# Patient Record
Sex: Female | Born: 1980 | Race: Black or African American | Hispanic: No | Marital: Married | Smoking: Former smoker
Health system: Southern US, Community
[De-identification: ages and names within clinical notes are randomized; demographics above are authoritative.]

## PROBLEM LIST (undated history)

## (undated) DIAGNOSIS — J40 Bronchitis, not specified as acute or chronic: Secondary | ICD-10-CM

## (undated) DIAGNOSIS — Z87891 Personal history of nicotine dependence: Secondary | ICD-10-CM

## (undated) DIAGNOSIS — J302 Other seasonal allergic rhinitis: Secondary | ICD-10-CM

## (undated) DIAGNOSIS — O039 Complete or unspecified spontaneous abortion without complication: Secondary | ICD-10-CM

## (undated) DIAGNOSIS — F419 Anxiety disorder, unspecified: Secondary | ICD-10-CM

## (undated) DIAGNOSIS — A64 Unspecified sexually transmitted disease: Secondary | ICD-10-CM

## (undated) DIAGNOSIS — N809 Endometriosis, unspecified: Secondary | ICD-10-CM

## (undated) DIAGNOSIS — R51 Headache: Secondary | ICD-10-CM

## (undated) DIAGNOSIS — I10 Essential (primary) hypertension: Secondary | ICD-10-CM

## (undated) DIAGNOSIS — O009 Unspecified ectopic pregnancy without intrauterine pregnancy: Secondary | ICD-10-CM

## (undated) DIAGNOSIS — E785 Hyperlipidemia, unspecified: Secondary | ICD-10-CM

## (undated) DIAGNOSIS — R519 Headache, unspecified: Secondary | ICD-10-CM

## (undated) DIAGNOSIS — K219 Gastro-esophageal reflux disease without esophagitis: Secondary | ICD-10-CM

## (undated) HISTORY — DX: Anxiety disorder, unspecified: F41.9

## (undated) HISTORY — DX: Unspecified sexually transmitted disease: A64

## (undated) HISTORY — PX: ABDOMINAL HYSTERECTOMY: SHX81

## (undated) HISTORY — PX: GYNECOLOGIC CRYOSURGERY: SHX857

## (undated) HISTORY — DX: Endometriosis, unspecified: N80.9

---

## 2013-03-04 ENCOUNTER — Encounter (HOSPITAL_COMMUNITY): Payer: Self-pay | Admitting: *Deleted

## 2013-03-04 ENCOUNTER — Emergency Department (HOSPITAL_COMMUNITY)
Admission: EM | Admit: 2013-03-04 | Discharge: 2013-03-04 | Disposition: A | Discharge status: Home / Self Care | Payer: BC Managed Care – PPO | Attending: Emergency Medicine | Admitting: Emergency Medicine

## 2013-03-04 DIAGNOSIS — Z87891 Personal history of nicotine dependence: Secondary | ICD-10-CM | POA: Insufficient documentation

## 2013-03-04 DIAGNOSIS — K089 Disorder of teeth and supporting structures, unspecified: Secondary | ICD-10-CM | POA: Insufficient documentation

## 2013-03-04 DIAGNOSIS — Z79899 Other long term (current) drug therapy: Secondary | ICD-10-CM | POA: Insufficient documentation

## 2013-03-04 DIAGNOSIS — K0889 Other specified disorders of teeth and supporting structures: Secondary | ICD-10-CM

## 2013-03-04 MED ORDER — PENICILLIN V POTASSIUM 500 MG PO TABS: 500.0000 mg | ORAL_TABLET | Freq: Four times a day (QID) | ORAL | Status: AC

## 2013-03-04 MED ORDER — HYDROCODONE-ACETAMINOPHEN 5-325 MG PO TABS: 2.0000 | ORAL_TABLET | Freq: Four times a day (QID) | ORAL | Status: DC | PRN

## 2013-03-04 NOTE — ED Provider Notes (Signed)
Medical screening examination/treatment/procedure(s) were performed by non-physician practitioner and as supervising physician I was immediately available for consultation/collaboration.   Pearson Reasons Y. Reice Bienvenue, MD 03/04/13 2200 

## 2013-03-04 NOTE — ED Notes (Signed)
Pt is here with right upper tooth pain that has chipped off

## 2013-03-04 NOTE — ED Notes (Signed)
Pt reports upper right tooth chipped on Thursday and pain has been increasing since.  Pt called dentist and cannot be seen for 2 weeks.  Pt alert oriented X 4

## 2013-03-04 NOTE — ED Provider Notes (Signed)
History     CSN: [id number]  Arrival date & time 03/04/13  9147   First MD Initiated Contact with Patient 03/04/13 [phone]      Chief Complaint  Patient presents with  . Dental Pain    (Consider location/radiation/quality/duration/timing/severity/associated sxs/prior treatment) HPI Comments: Patient reports that she began having pain in her right upper tooth five days ago.  A piece of her tooth chipped off while she was flossing.  No dental injury.  She has attempted to call her dentist, but was told that she could not get in for an appointment until 03/19/13.    Patient is a 32 y.o. female presenting with tooth pain. The history is provided by the patient.  Dental PainThe primary symptoms include mouth pain. Primary symptoms do not include fever. Episode onset: 5 days ago. The symptoms are worsening. The symptoms occur constantly.  Additional symptoms include: dental sensitivity to temperature and gum tenderness. Additional symptoms do not include: gum swelling, purulent gums, trismus, facial swelling and trouble swallowing.    History reviewed. No pertinent past medical history.  History reviewed. No pertinent past surgical history.  No family history on file.  History  Substance Use Topics  . Smoking status: Former Games developer  . Smokeless tobacco: Not on file  . Alcohol Use: Yes     Comment: occ    OB History   Grav Para Term Preterm Abortions TAB SAB Ect Mult Living                  Review of Systems  Constitutional: Negative for fever and chills.  HENT: Positive for dental problem. Negative for facial swelling, trouble swallowing, neck pain and neck stiffness.   All other systems reviewed and are negative.    Allergies  Review of patient's allergies indicates no known allergies.  Home Medications   Current Outpatient Rx  Name  Route  Sig  Dispense  Refill  . acetaminophen (TYLENOL) 325 MG tablet   Oral   Take 650 mg by mouth every 6 (six) hours as needed for  pain.         . Aspirin-Acetaminophen-Caffeine (GOODY HEADACHE PO)   Oral   Take 1 packet by mouth 2 (two) times daily as needed.         . Etonogestrel (IMPLANON [place])   Subcutaneous   Inject 1 Device into the skin once.           BP 130/67  Pulse 87  Temp(Src) 98.5 F (36.9 C) (Oral)  Resp 18  SpO2 100%  Physical Exam  Nursing note and vitals reviewed. Constitutional: She is oriented to person, place, and time. She appears well-developed and well-nourished. No distress.  HENT:  Head: Normocephalic and atraumatic. No trismus in the jaw.  Mouth/Throat: Uvula is midline, oropharynx is clear and moist and mucous membranes are normal. Abnormal dentition. No dental abscesses or edematous. No oropharyngeal exudate, posterior oropharyngeal edema, posterior oropharyngeal erythema or tonsillar abscesses.  Poor dental hygiene. Pt able to open and close mouth with out difficulty. Airway intact. Uvula midline. Mild gingival swelling with tenderness over affected area, but no fluctuance. No swelling or tenderness of submental and submandibular regions.  Eyes: Conjunctivae and EOM are normal.  Neck: Normal range of motion and full passive range of motion without pain. Neck supple.  Cardiovascular: Normal rate, regular rhythm and normal heart sounds.   Pulmonary/Chest: Effort normal and breath sounds normal. No stridor. No respiratory distress. She has no wheezes.  Musculoskeletal: Normal  range of motion.  Lymphadenopathy:       Head (right side): No submental, no submandibular, no tonsillar, no preauricular and no posterior auricular adenopathy present.       Head (left side): No submental, no submandibular, no tonsillar, no preauricular and no posterior auricular adenopathy present.    She has no cervical adenopathy.  Neurological: She is alert and oriented to person, place, and time.  Skin: Skin is warm and dry. No rash noted. She is not diaphoretic.    ED Course  Procedures  (including critical care time)  Labs Reviewed - No data to display No results found.   No diagnosis found.    MDM  Patient with toothache.  No gross abscess.  Exam unconcerning for Ludwig's angina or spread of infection.  Will treat with penicillin and pain medicine.  Urged patient to follow-up with dentist.          Pascal Lux Gerlach, PA-C 03/04/13 1719

## 2013-08-21 ENCOUNTER — Emergency Department (HOSPITAL_COMMUNITY): Payer: BC Managed Care – PPO

## 2013-08-21 ENCOUNTER — Emergency Department (HOSPITAL_COMMUNITY)
Admission: EM | Admit: 2013-08-21 | Discharge: 2013-08-21 | Disposition: A | Discharge status: Home / Self Care | Payer: BC Managed Care – PPO | Attending: Emergency Medicine | Admitting: Emergency Medicine

## 2013-08-21 ENCOUNTER — Encounter (HOSPITAL_COMMUNITY): Payer: Self-pay | Admitting: *Deleted

## 2013-08-21 DIAGNOSIS — B379 Candidiasis, unspecified: Secondary | ICD-10-CM | POA: Insufficient documentation

## 2013-08-21 DIAGNOSIS — O200 Threatened abortion: Secondary | ICD-10-CM | POA: Insufficient documentation

## 2013-08-21 DIAGNOSIS — Z87891 Personal history of nicotine dependence: Secondary | ICD-10-CM | POA: Insufficient documentation

## 2013-08-21 LAB — URINALYSIS, ROUTINE W REFLEX MICROSCOPIC
Glucose, UA: NEGATIVE mg/dL
Leukocytes, UA: NEGATIVE
Specific Gravity, Urine: 1.03 (ref 1.005–1.030)
pH: 8 (ref 5.0–8.0)

## 2013-08-21 LAB — POCT PREGNANCY, URINE: Preg Test, Ur: POSITIVE — AB

## 2013-08-21 LAB — BASIC METABOLIC PANEL
BUN: 11 mg/dL (ref 6–23)
Chloride: 102 mEq/L (ref 96–112)
GFR calc Af Amer: >90 mL/min (ref >90)
Glucose, Bld: 89 mg/dL (ref 70–99)
Potassium: 4.2 mEq/L (ref 3.5–5.1)
Sodium: 136 mEq/L (ref 135–145)

## 2013-08-21 LAB — CBC WITH DIFFERENTIAL/PLATELET
Hemoglobin: 13 g/dL (ref 12.0–15.0)
Lymphs Abs: 2.7 10*3/uL (ref 0.7–4.0)
Monocytes Relative: 5 % (ref 3–12)
Neutro Abs: 4.5 10*3/uL (ref 1.7–7.7)
Neutrophils Relative %: 58 % (ref 43–77)
Platelets: 246 10*3/uL (ref 150–400)
RBC: 4.44 MIL/uL (ref 3.87–5.11)
WBC: 7.7 10*3/uL (ref 4.0–10.5)

## 2013-08-21 LAB — WET PREP, GENITAL: Trich, Wet Prep: NONE SEEN

## 2013-08-21 LAB — ABO/RH

## 2013-08-21 LAB — URINE MICROSCOPIC-ADD ON

## 2013-08-21 LAB — HCG, QUANTITATIVE, PREGNANCY: hCG, Beta Chain, Quant, S: 2936 m[IU]/mL — ABNORMAL HIGH (ref <5)

## 2013-08-21 MED ORDER — PRENATAL COMPLETE 14-0.4 MG PO TABS: 1.0000 | ORAL_TABLET | Freq: Every day | ORAL | Status: DC

## 2013-08-21 NOTE — ED Notes (Signed)
Pt reports having irregular vaginal bleeding this month, took preg test this am and it was +, having light bleeding today. No acute distress noted at triage.

## 2013-08-21 NOTE — ED Provider Notes (Signed)
Medical screening examination/treatment/procedure(s) were performed by non-physician practitioner and as supervising physician I was immediately available for consultation/collaboration.  Shon Baton, MD 08/21/13 [phone]

## 2013-08-21 NOTE — ED Provider Notes (Signed)
CSN: [id number]     Arrival date & time 08/21/13  1140 History   First MD Initiated Contact with Patient 08/21/13 1301     Chief Complaint  Patient presents with  . Vaginal Bleeding   (Consider location/radiation/quality/duration/timing/severity/associated sxs/prior Treatment) The history is provided by the patient and medical records.   Patient presents to the ED for irregular menstrual cycle. Patient states she thought her last period was 08/04/2013, but only lasted 2 days which is abnormal for her. Did home pregnancy test this morning which was positive. Patient is now G3P1.  Pt states bleeding has subsided and she only has mild, intermittent lower abdominal cramping.  Denies any nausea, vomiting, or diarrhea.  Patient is not currently on birth control.  Patient does have a history of prior ectopic pregnancy.  Pt has OB-GYN in [place].  History reviewed. No pertinent past medical history. History reviewed. No pertinent past surgical history. History reviewed. No pertinent family history. History  Substance Use Topics  . Smoking status: Former Games developer  . Smokeless tobacco: Not on file  . Alcohol Use: Yes     Comment: occ   OB History   Grav Para Term Preterm Abortions TAB SAB Ect Mult Living                 Review of Systems  Genitourinary: Positive for vaginal bleeding.  All other systems reviewed and are negative.    Allergies  Review of patient's allergies indicates no known allergies.  Home Medications   Current Outpatient Rx  Name  Route  Sig  Dispense  Refill  . Diphenhydramine-APAP, sleep, (GOODY PM PO)   Oral   Take 1 packet by mouth as needed (for pain/sleep).          BP 142/79  Pulse 109  Temp(Src) 98.5 F (36.9 C) (Oral)  Resp 18  SpO2 97%  LMP 08/04/2013  Physical Exam  Nursing note and vitals reviewed. Constitutional: She is oriented to person, place, and time. She appears well-developed and well-nourished. No distress.  HENT:  Head:  Normocephalic and atraumatic.  Eyes: Conjunctivae and EOM are normal. Pupils are equal, round, and reactive to light.  Neck: Normal range of motion. Neck supple.  Cardiovascular: Normal rate, regular rhythm and normal heart sounds.   Pulmonary/Chest: Effort normal and breath sounds normal. No respiratory distress. She has no wheezes.  Abdominal: Soft. Bowel sounds are normal. There is no tenderness. There is no guarding.  Lower abdominal "cramping" without focal TTP  Genitourinary: There is no lesion on the right labia. There is no lesion on the left labia. Cervix exhibits no motion tenderness. Right adnexum displays no tenderness. Left adnexum displays no tenderness. There is bleeding around the vagina. Vaginal discharge found.  Cervical os closed; scant, non-odorous, purulent vaginal discharge with brown streaking; no adnexal or CMT  Musculoskeletal: Normal range of motion. She exhibits no edema.  Neurological: She is alert and oriented to person, place, and time.  Skin: Skin is warm and dry. She is not diaphoretic.  Psychiatric: She has a normal mood and affect.    ED Course  Procedures (including critical care time) Labs Review Labs Reviewed  WET PREP, GENITAL - Abnormal; Notable for the following:    Yeast Wet Prep HPF POC MANY (*)    WBC, Wet Prep HPF POC FEW (*)    All other components within normal limits  URINALYSIS, ROUTINE W REFLEX MICROSCOPIC - Abnormal; Notable for the following:    Color, Urine  AMBER (*)    Hgb urine dipstick TRACE (*)    Ketones, ur 15 (*)    All other components within normal limits  BASIC METABOLIC PANEL - Abnormal; Notable for the following:    GFR calc non Af Amer 85 (*)    All other components within normal limits  HCG, QUANTITATIVE, PREGNANCY - Abnormal; Notable for the following:    hCG, Beta Chain, Quant, S 2936 (*)    All other components within normal limits  POCT PREGNANCY, URINE - Abnormal; Notable for the following:    Preg Test, Ur  POSITIVE (*)    All other components within normal limits  GC/CHLAMYDIA PROBE AMP  CBC WITH DIFFERENTIAL  URINE MICROSCOPIC-ADD ON  ABO/RH   Imaging Review US Ob Comp Less 14 Wks  08/21/2013   *RADIOLOGY REPORT*  Clinical Data: Early pregnancy.  Vaginal bleeding and pelvic cramping.  OBSTETRIC <14 WK [place] AND TRANSVAGINAL OB US  Technique:  Both transabdominal and transvaginal ultrasound examinations were performed for complete evaluation of the gestation as well as the maternal uterus, adnexal regions, and pelvic cul-de-sac.  Transvaginal technique was performed to assess early pregnancy.  Comparison:  None.  Intrauterine gestational sac:  None Yolk sac: None Embryo: None Cardiac Activity: None  Maternal uterus/adnexae: There is a 5.0 x 4.1 x 4.4 cm fibroid in the body of the uterus which obscures the endometrium in the fundus.  There is a 1.5 x 1.0 x 1.3 cm simple appearing cyst on the left ovary.  There is a slightly complex 1.6 x 1.4 x 1.5 cm cyst in the right ovary with some debris in the dependent portion of the cyst. This probably represents a hemorrhagic cyst.  There is a small amount of free fluid in the pelvic cul-de-sac.  IMPRESSION:  1. No visible intrauterine gestational sac. 2.  Small cysts in both ovaries. 3.  No visible adnexal masses. 4.  5 cm fibroid in the uterus obscures the endometrium in the fundus.   Original Report Authenticated By: Francene Boyers, M.D.   US Ob Transvaginal  08/21/2013   *RADIOLOGY REPORT*  Clinical Data: Early pregnancy.  Vaginal bleeding and pelvic cramping.  OBSTETRIC <14 WK [place] AND TRANSVAGINAL OB US  Technique:  Both transabdominal and transvaginal ultrasound examinations were performed for complete evaluation of the gestation as well as the maternal uterus, adnexal regions, and pelvic cul-de-sac.  Transvaginal technique was performed to assess early pregnancy.  Comparison:  None.  Intrauterine gestational sac:  None Yolk sac: None Embryo: None Cardiac Activity:  None  Maternal uterus/adnexae: There is a 5.0 x 4.1 x 4.4 cm fibroid in the body of the uterus which obscures the endometrium in the fundus.  There is a 1.5 x 1.0 x 1.3 cm simple appearing cyst on the left ovary.  There is a slightly complex 1.6 x 1.4 x 1.5 cm cyst in the right ovary with some debris in the dependent portion of the cyst. This probably represents a hemorrhagic cyst.  There is a small amount of free fluid in the pelvic cul-de-sac.  IMPRESSION:  1. No visible intrauterine gestational sac. 2.  Small cysts in both ovaries. 3.  No visible adnexal masses. 4.  5 cm fibroid in the uterus obscures the endometrium in the fundus.   Original Report Authenticated By: Francene Boyers, M.D.    MDM   1. Threatened abortion in first trimester   2. Yeast infection     u-preg positive.  U/a without signs of  infection, trace blood.  Wet prep with many yeast.  Gc/Chl pending.  U/s as above.  No visible uterine sac at this time, quant 2936.  ABO/Rh is A+.  I have recommended close FU with OB-GYN.  She will be started on pre-natal vitamins.  Instructed to use OTC monistat for yeast infection.  Given strict return precautions and miscarriage warning signs that would warrant ED return.  Discussed plan with pt, she agreed.  Return precautions advised.  Garlon Hatchet, PA-C 08/21/13 [phone]

## 2013-09-16 ENCOUNTER — Emergency Department (HOSPITAL_COMMUNITY)
Admission: EM | Admit: 2013-09-16 | Discharge: 2013-09-16 | Discharge status: Against Medical Advice | Payer: BC Managed Care – PPO | Attending: Emergency Medicine | Admitting: Emergency Medicine

## 2013-09-16 ENCOUNTER — Encounter (HOSPITAL_COMMUNITY): Payer: Self-pay | Admitting: Emergency Medicine

## 2013-09-16 DIAGNOSIS — Z87891 Personal history of nicotine dependence: Secondary | ICD-10-CM | POA: Insufficient documentation

## 2013-09-16 DIAGNOSIS — R109 Unspecified abdominal pain: Secondary | ICD-10-CM

## 2013-09-16 DIAGNOSIS — N898 Other specified noninflammatory disorders of vagina: Secondary | ICD-10-CM | POA: Insufficient documentation

## 2013-09-16 DIAGNOSIS — Z79899 Other long term (current) drug therapy: Secondary | ICD-10-CM | POA: Insufficient documentation

## 2013-09-16 DIAGNOSIS — Z8759 Personal history of other complications of pregnancy, childbirth and the puerperium: Secondary | ICD-10-CM

## 2013-09-16 DIAGNOSIS — Z3201 Encounter for pregnancy test, result positive: Secondary | ICD-10-CM | POA: Insufficient documentation

## 2013-09-16 DIAGNOSIS — Z8742 Personal history of other diseases of the female genital tract: Secondary | ICD-10-CM | POA: Insufficient documentation

## 2013-09-16 DIAGNOSIS — R1032 Left lower quadrant pain: Secondary | ICD-10-CM | POA: Insufficient documentation

## 2013-09-16 HISTORY — DX: Unspecified ectopic pregnancy without intrauterine pregnancy: O00.90

## 2013-09-16 LAB — URINALYSIS, ROUTINE W REFLEX MICROSCOPIC
Glucose, UA: NEGATIVE mg/dL
Leukocytes, UA: NEGATIVE
Protein, ur: NEGATIVE mg/dL
pH: 8.5 — ABNORMAL HIGH (ref 5.0–8.0)

## 2013-09-16 LAB — CBC WITH DIFFERENTIAL/PLATELET
HCT: 37.6 % (ref 36.0–46.0)
Hemoglobin: 12.9 g/dL (ref 12.0–15.0)
Lymphocytes Relative: 35 % (ref 12–46)
Lymphs Abs: 2.2 10*3/uL (ref 0.7–4.0)
MCH: 30.1 pg (ref 26.0–34.0)
MCV: 87.9 fL (ref 78.0–100.0)
Neutro Abs: 3.7 10*3/uL (ref 1.7–7.7)
Neutrophils Relative %: 58 % (ref 43–77)
Platelets: 242 10*3/uL (ref 150–400)

## 2013-09-16 LAB — COMPREHENSIVE METABOLIC PANEL
ALT: 10 U/L (ref 0–35)
AST: 13 U/L (ref 0–37)
Albumin: 3.7 g/dL (ref 3.5–5.2)
Alkaline Phosphatase: 48 U/L (ref 39–117)
Calcium: 8.8 mg/dL (ref 8.4–10.5)
GFR calc Af Amer: >90 mL/min (ref >90)
Glucose, Bld: 82 mg/dL (ref 70–99)
Potassium: 4 mEq/L (ref 3.5–5.1)
Sodium: 141 mEq/L (ref 135–145)
Total Protein: 6.7 g/dL (ref 6.0–8.3)

## 2013-09-16 LAB — PREGNANCY, URINE: Preg Test, Ur: POSITIVE — AB

## 2013-09-16 LAB — HCG, QUANTITATIVE, PREGNANCY: hCG, Beta Chain, Quant, S: 14 m[IU]/mL — ABNORMAL HIGH (ref <5)

## 2013-09-16 NOTE — ED Provider Notes (Signed)
CSN: [id number]     Arrival date & time 09/16/13  1032 History   First MD Initiated Contact with Patient 09/16/13 1142     Chief Complaint  Patient presents with  . Abdominal Pain   (Consider location/radiation/quality/duration/timing/severity/associated sxs/prior Treatment) HPI Comments: Patient is a 32 year old female who is s/p ectopic pregnancy with surgery 2 weeks ago who presents with abdominal pain that started this morning. The pain is located in her LLQ and does radiate. The pain is described as aching and severe. The pain started gradually and progressively worsened since the onset. No alleviating/aggravating factors. The patient has tried nothing for symptoms without relief. Associated symptoms include vaginal spotting. Patient denies fever, headache, NVD, chest pain, SOB, dysuria, constipation.      Past Medical History  Diagnosis Date  . Ectopic pregnancy    History reviewed. No pertinent past surgical history. History reviewed. No pertinent family history. History  Substance Use Topics  . Smoking status: Former Games developer  . Smokeless tobacco: Not on file  . Alcohol Use: Yes     Comment: occ   OB History   Grav Para Term Preterm Abortions TAB SAB Ect Mult Living                 Review of Systems  Gastrointestinal: Positive for abdominal pain.  All other systems reviewed and are negative.    Allergies  Review of patient's allergies indicates no known allergies.  Home Medications   Current Outpatient Rx  Name  Route  Sig  Dispense  Refill  . Diphenhydramine-APAP, sleep, (GOODY PM PO)   Oral   Take 1 packet by mouth as needed (for pain/sleep).         Marland Kitchen HYDROcodone-acetaminophen (NORCO) 10-325 MG per tablet   Oral   Take 1 tablet by mouth every 6 (six) hours as needed for pain.         Marland Kitchen ibuprofen (ADVIL,MOTRIN) 200 MG tablet   Oral   Take 200 mg by mouth every 6 (six) hours as needed for pain (pain).         . Prenatal Vit-Fe Fumarate-FA  (PRENATAL COMPLETE) 14-0.4 MG TABS   Oral   Take 1 tablet by mouth daily.   60 each   0    BP 148/83  Pulse 89  Temp(Src) 98.9 F (37.2 C) (Oral)  Resp 18  Ht 4\' 11"  (1.499 m)  Wt 164 lb 14.4 oz (74.798 kg)  BMI 33.29 kg/m2  SpO2 99%  LMP 08/04/2013 Physical Exam  Nursing note and vitals reviewed. Constitutional: She is oriented to person, place, and time. She appears well-developed and well-nourished. No distress.  HENT:  Head: Normocephalic and atraumatic.  Eyes: Conjunctivae and EOM are normal.  Neck: Normal range of motion.  Cardiovascular: Normal rate and regular rhythm.  Exam reveals no gallop and no friction rub.   No murmur heard. Pulmonary/Chest: Effort normal and breath sounds normal. She has no wheezes. She has no rales. She exhibits no tenderness.  Abdominal: Soft. She exhibits no distension. There is tenderness. There is no rebound and no guarding.  Left lower quadrant tenderness to palpation. No peritoneal signs or other focal tenderness to palpation.   Musculoskeletal: Normal range of motion.  Neurological: She is alert and oriented to person, place, and time. Coordination normal.  Speech is goal-oriented. Moves limbs without ataxia.   Skin: Skin is warm and dry.  Psychiatric: She has a normal mood and affect. Her behavior is normal.  ED Course  Procedures (including critical care time) Labs Review Labs Reviewed  COMPREHENSIVE METABOLIC PANEL - Abnormal; Notable for the following:    Total Bilirubin 0.2 (*)    GFR calc non Af Amer 87 (*)    All other components within normal limits  URINALYSIS, ROUTINE W REFLEX MICROSCOPIC - Abnormal; Notable for the following:    pH 8.5 (*)    All other components within normal limits  PREGNANCY, URINE - Abnormal; Notable for the following:    Preg Test, Ur POSITIVE (*)    All other components within normal limits  URINE CULTURE  CBC WITH DIFFERENTIAL  HCG, QUANTITATIVE, PREGNANCY   Imaging Review No results  found.  EKG Interpretation   None       MDM   1. Abdominal pain   2. Positive urine pregnancy test   3. History of ectopic pregnancy     11:48 AM Labs and urinalysis pending. Vitals stable and patient afebrile.   1:15 PM Patient's urinalysis likely shows false positive pregnancy test due to recent ectopic pregnancy. Patient needs to leave and take her mother to an appointment so she will leave AMA. Patient says she will return after the appointment for a pelvic US.     Emilia Beck, PA-C 09/16/13 1327

## 2013-09-16 NOTE — ED Notes (Signed)
Patient had surgery two weeks ago to remove an ectopic pregnancy.  The patient had a follow up appointment yesterday with her primary care physician and they said everything looked good.  She started to have pain about 0300 in the morning and it has gotten worse.  She decided to come in to be evaluated.  She did not call the PCP today and she is not certain if this pain is normal after surgery.

## 2013-09-16 NOTE — ED Notes (Signed)
Pt c/o lower abd pain; pt sts had sx for ectopic pregnancy 2 weeks ago; pt sts some vaginal spotting and sts pain started today

## 2013-09-17 LAB — URINE CULTURE: Colony Count: 2000

## 2013-09-19 NOTE — ED Provider Notes (Signed)
Medical screening examination/treatment/procedure(s) were performed by non-physician practitioner and as supervising physician I was immediately available for consultation/collaboration.   Laray Anger, DO 09/19/13 1321

## 2013-12-04 HISTORY — PX: ECTOPIC PREGNANCY SURGERY: SHX613

## 2015-01-19 ENCOUNTER — Emergency Department (HOSPITAL_COMMUNITY)
Admission: EM | Admit: 2015-01-19 | Discharge: 2015-01-19 | Disposition: A | Discharge status: Home / Self Care | Payer: Self-pay | Attending: Emergency Medicine | Admitting: Emergency Medicine

## 2015-01-19 ENCOUNTER — Encounter (HOSPITAL_COMMUNITY): Payer: Self-pay | Admitting: Neurology

## 2015-01-19 DIAGNOSIS — Z79899 Other long term (current) drug therapy: Secondary | ICD-10-CM | POA: Insufficient documentation

## 2015-01-19 DIAGNOSIS — Z3A01 Less than 8 weeks gestation of pregnancy: Secondary | ICD-10-CM | POA: Insufficient documentation

## 2015-01-19 DIAGNOSIS — N939 Abnormal uterine and vaginal bleeding, unspecified: Secondary | ICD-10-CM

## 2015-01-19 DIAGNOSIS — Z87891 Personal history of nicotine dependence: Secondary | ICD-10-CM | POA: Insufficient documentation

## 2015-01-19 DIAGNOSIS — O9989 Other specified diseases and conditions complicating pregnancy, childbirth and the puerperium: Secondary | ICD-10-CM | POA: Insufficient documentation

## 2015-01-19 DIAGNOSIS — R000 Tachycardia, unspecified: Secondary | ICD-10-CM | POA: Insufficient documentation

## 2015-01-19 DIAGNOSIS — O200 Threatened abortion: Secondary | ICD-10-CM | POA: Insufficient documentation

## 2015-01-19 HISTORY — DX: Complete or unspecified spontaneous abortion without complication: O03.9

## 2015-01-19 LAB — CBC WITH DIFFERENTIAL/PLATELET
BASOS ABS: 0 10*3/uL (ref 0.0–0.1)
BASOS PCT: 1 % (ref 0–1)
EOS ABS: 0.1 10*3/uL (ref 0.0–0.7)
EOS PCT: 1 % (ref 0–5)
HEMATOCRIT: 37.1 % (ref 36.0–46.0)
HEMOGLOBIN: 12.4 g/dL (ref 12.0–15.0)
Lymphocytes Relative: 38 % (ref 12–46)
Lymphs Abs: 2.2 10*3/uL (ref 0.7–4.0)
MCH: 29.2 pg (ref 26.0–34.0)
MCHC: 33.4 g/dL (ref 30.0–36.0)
MCV: 87.5 fL (ref 78.0–100.0)
MONO ABS: 0.3 10*3/uL (ref 0.1–1.0)
MONOS PCT: 6 % (ref 3–12)
Neutro Abs: 3.2 10*3/uL (ref 1.7–7.7)
Neutrophils Relative %: 54 % (ref 43–77)
Platelets: 232 10*3/uL (ref 150–400)
RBC: 4.24 MIL/uL (ref 3.87–5.11)
RDW: 12.7 % (ref 11.5–15.5)
WBC: 5.8 10*3/uL (ref 4.0–10.5)

## 2015-01-19 LAB — BASIC METABOLIC PANEL
Anion gap: 7 (ref 5–15)
BUN: 6 mg/dL (ref 6–23)
CALCIUM: 8.8 mg/dL (ref 8.4–10.5)
CO2: 23 mmol/L (ref 19–32)
CREATININE: 0.97 mg/dL (ref 0.50–1.10)
Chloride: 108 mmol/L (ref 96–112)
GFR calc Af Amer: 89 mL/min — ABNORMAL LOW (ref >90)
GFR, EST NON AFRICAN AMERICAN: 76 mL/min — AB (ref >90)
Glucose, Bld: 117 mg/dL — ABNORMAL HIGH (ref 70–99)
Potassium: 3.8 mmol/L (ref 3.5–5.1)
SODIUM: 138 mmol/L (ref 135–145)

## 2015-01-19 LAB — WET PREP, GENITAL
Trich, Wet Prep: NONE SEEN
Yeast Wet Prep HPF POC: NONE SEEN

## 2015-01-19 LAB — I-STAT BETA HCG BLOOD, ED (MC, WL, AP ONLY): I-stat hCG, quantitative: 113.1 m[IU]/mL — ABNORMAL HIGH (ref <5)

## 2015-01-19 MED ORDER — SODIUM CHLORIDE 0.9 % IV BOLUS (SEPSIS)
1000.0000 mL | Freq: Once | INTRAVENOUS | Status: AC
  Administered 2015-01-19: 1000 mL via INTRAVENOUS

## 2015-01-19 NOTE — ED Provider Notes (Signed)
CSN: [id number]     Arrival date & time 01/19/15  1009 History   First MD Initiated Contact with Patient 01/19/15 1011     Chief Complaint  Patient presents with  . Vaginal Bleeding     (Consider location/radiation/quality/duration/timing/severity/associated sxs/prior Treatment) HPI Comments: Patient with history of left-sided ectopic pregnancy treated with surgery however patient states that she still has her fallopian tube -- presents with known pregnancy and spotting. Patient initially had a small amount of vaginal bleeding 5 days ago. This stopped the next day so she took a pregnancy test which was positive. The subsequent day she was having some abdominal pain and she presented to an outside clinic where she had a quantitative approximately 100-200, and ultrasound showing a right-sided cyst but no other problems and no intrauterine pregnancy. Patient was discharged to follow-up with her OB doctor. This morning she started having a small amount of spotting again. She has not developed any pain and she has not felt lightheaded or short of breath and has not passed out. No nausea, vomiting, diarrhea or urinary symptoms. No treatments prior to arrival. Patient states that she called her after-hours OB/GYN hotline was referred to the emergency department for evaluation. The onset of this condition was acute. The course is constant. Aggravating factors: none. Alleviating factors: none.    Patient is a 34 y.o. female presenting with vaginal bleeding. The history is provided by the patient.  Vaginal Bleeding Associated symptoms: no abdominal pain, no dysuria, no fever and no nausea     Past Medical History  Diagnosis Date  . Ectopic pregnancy   . Miscarriage    History reviewed. No pertinent past surgical history. No family history on file. History  Substance Use Topics  . Smoking status: Former Research scientist (life sciences)  . Smokeless tobacco: Not on file  . Alcohol Use: Yes     Comment: occ   OB History      No data available     Review of Systems  Constitutional: Negative for fever.  HENT: Negative for rhinorrhea and sore throat.   Eyes: Negative for redness.  Respiratory: Negative for cough.   Cardiovascular: Negative for chest pain.  Gastrointestinal: Negative for nausea, vomiting, abdominal pain and diarrhea.  Genitourinary: Positive for vaginal bleeding. Negative for dysuria.  Musculoskeletal: Negative for myalgias.  Skin: Negative for rash.  Neurological: Negative for headaches.   Allergies  Review of patient's allergies indicates no known allergies.  Home Medications   Prior to Admission medications   Medication Sig Start Date End Date Taking? Authorizing Provider  Diphenhydramine-APAP, sleep, (GOODY PM PO) Take 1 packet by mouth as needed (for pain/sleep).    Historical Provider, MD  HYDROcodone-acetaminophen (NORCO) 10-325 MG per tablet Take 1 tablet by mouth every 6 (six) hours as needed for pain.    Historical Provider, MD  ibuprofen (ADVIL,MOTRIN) 200 MG tablet Take 200 mg by mouth every 6 (six) hours as needed for pain (pain).    Historical Provider, MD  Prenatal Vit-Fe Fumarate-FA (PRENATAL COMPLETE) 14-0.4 MG TABS Take 1 tablet by mouth daily. 08/21/13   Larene Pickett, PA-C   BP 138/81 mmHg  Pulse 110  Temp(Src) 98.3 F (36.8 C) (Oral)  Resp 14  SpO2 99%  LMP 12/13/2014   Physical Exam  Constitutional: She appears well-developed and well-nourished.  HENT:  Head: Normocephalic and atraumatic.  Eyes: Conjunctivae are normal. Right eye exhibits no discharge. Left eye exhibits no discharge.  Neck: Normal range of motion. Neck supple.  Cardiovascular:  Regular rhythm and normal heart sounds.  Tachycardia present.   No murmur heard. Pulmonary/Chest: Effort normal and breath sounds normal.  Abdominal: Soft. Bowel sounds are normal. She exhibits no distension. There is no tenderness. There is no rebound and no guarding.  No tenderness, guarding, or rebound on exam.    Genitourinary: There is no rash, tenderness or lesion on the right labia. There is no rash, tenderness or lesion on the left labia. Uterus is not tender. Cervix exhibits discharge (blood). Cervix exhibits no motion tenderness and no friability. There is bleeding in the vagina. No erythema or tenderness in the vagina. No foreign body around the vagina. No signs of injury around the vagina. No vaginal discharge found.    Neurological: She is alert.  Skin: Skin is warm and dry.  Psychiatric: She has a normal mood and affect.  Nursing note and vitals reviewed.   ED Course  Procedures (including critical care time) Labs Review Labs Reviewed  WET PREP, GENITAL - Abnormal; Notable for the following:    Clue Cells Wet Prep HPF POC FEW (*)    WBC, Wet Prep HPF POC FEW (*)    All other components within normal limits  BASIC METABOLIC PANEL - Abnormal; Notable for the following:    Glucose, Bld 117 (*)    GFR calc non Af Amer 76 (*)    GFR calc Af Amer 89 (*)    All other components within normal limits  I-STAT BETA HCG BLOOD, ED ([facility], [facility], [facility] ONLY) - Abnormal; Notable for the following:    I-stat hCG, quantitative 113.1 (*)    All other components within normal limits  CBC WITH DIFFERENTIAL/PLATELET  GC/CHLAMYDIA PROBE AMP ([place])    Imaging Review No results found.   EKG Interpretation None       10:48 AM Patient seen and examined. Work-up initiated. Discussed with Dr. Leonides Schanz.   Vital signs reviewed and are as follows: BP 138/81 mmHg  Pulse 110  Temp(Src) 98.3 F (36.8 C) (Oral)  Resp 14  SpO2 99%  LMP 12/13/2014  12:48 PM Findings reviewed with Dr. Leonides Schanz. Patient informed of results. Informed of need for follow-up and patient will call her gynecologist upon leaving here today for an appointment and for serial beta hCGs.  Patient states that her heart rate typically runs a little high. We will give her fluids and if still doing well will discharge to home. Patient  continues to have no abdominal pain whatsoever. Do not suspect ruptured ectopic pregnancy.    MDM   Final diagnoses:  Vaginal bleeding  Threatened abortion   Patient presents with complaint of vaginal bleeding in setting of pregnancy. Patient was seen at [facility] emergency department 3 days ago. I was able to review these records in care everywhere. She had a Quant of 175 and an ultrasound which did not demonstrate IUP, showed right ovarian cyst. Patient returns today with continued bleeding but no pain. Bleeding is mild. Electrolytes and blood counts are normal. I-STAT hCG is lower today than results from 3 days ago. This may suggest impending spontaneous abortion. Patient currently does not have any pain or peritonitis to suggest ruptured ectopic pregnancy. Her vital signs show mild tachycardia. Patient has had this in the past on other ED notes but not always. Do not suspect that this is tachycardia due to acute blood loss or infection. Patient was treated with 1 L of normal saline. She does not feel lightheaded with standing. Patient has reliable OB/GYN  follow-up and she plans to follow-up in 2 days for recheck of her Quant. Patient informed that this represents a threatened abortion and that she will need further trending of her Quant in order to make a final diagnosis.   Carlisle Cater, PA-C 01/19/15 [place], DO 01/19/15 1520

## 2015-01-19 NOTE — ED Notes (Signed)
Pt reports vaginal bleeding on Thursday that stopped, spotting starting again this morning that is small. Has hx of ectopic pregnancy 2 years ago, she found out she was pregnant on Friday via home test. Denies pain.

## 2015-01-19 NOTE — Discharge Instructions (Signed)
Please read and follow all provided instructions.  Your diagnoses today include:  1. Vaginal bleeding   2. Threatened abortion     Tests performed today include:  Blood counts and electrolytes - normal blood counts  Blood tests to check kidney function  Quantitative hcg - 113.1  Vital signs. See below for your results today.   Medications prescribed:   None  Take any prescribed medications only as directed.  Home care instructions:   Follow any educational materials contained in this packet.  Follow-up instructions: Please follow-up with your OB/GYN in the next 2 days for a recheck of your quant.    Return instructions:  SEEK IMMEDIATE MEDICAL ATTENTION IF:  The pain does not go away or becomes severe   A temperature above 101F develops   Repeated vomiting occurs (multiple episodes)   The pain becomes localized to portions of the abdomen. The right side could possibly be appendicitis. In an adult, the left lower portion of the abdomen could be colitis or diverticulitis.   Blood is being passed in stools or vomit (bright red or black tarry stools)   You develop chest pain, difficulty breathing, dizziness or fainting, or become confused, poorly responsive, or inconsolable (young children)  If you have any other emergent concerns regarding your health  Additional Information: Abdominal (belly) pain can be caused by many things. Your caregiver performed an examination and possibly ordered blood/urine tests and imaging (CT scan, x-rays, ultrasound). Many cases can be observed and treated at home after initial evaluation in the emergency department. Even though you are being discharged home, abdominal pain can be unpredictable. Therefore, you need a repeated exam if your pain does not resolve, returns, or worsens. Most patients with abdominal pain don't have to be admitted to the hospital or have surgery, but serious problems like appendicitis and gallbladder attacks can  start out as nonspecific pain. Many abdominal conditions cannot be diagnosed in one visit, so follow-up evaluations are very important.  Your vital signs today were: BP 116/70 mmHg   Pulse 105   Temp(Src) 98.3 F (36.8 C) (Oral)   Resp 16   SpO2 99%   LMP 12/13/2014 If your blood pressure (bp) was elevated above 135/85 this visit, please have this repeated by your doctor within one month. --------------

## 2015-01-20 LAB — GC/CHLAMYDIA PROBE AMP (~~LOC~~) NOT AT ARMC
Chlamydia: NEGATIVE
Neisseria Gonorrhea: NEGATIVE

## 2015-02-25 ENCOUNTER — Emergency Department (HOSPITAL_COMMUNITY)
Admission: EM | Admit: 2015-02-25 | Discharge: 2015-02-25 | Disposition: A | Discharge status: Home / Self Care | Payer: Self-pay | Attending: Emergency Medicine | Admitting: Emergency Medicine

## 2015-02-25 ENCOUNTER — Encounter (HOSPITAL_COMMUNITY): Payer: Self-pay

## 2015-02-25 DIAGNOSIS — Z87891 Personal history of nicotine dependence: Secondary | ICD-10-CM | POA: Insufficient documentation

## 2015-02-25 DIAGNOSIS — R51 Headache: Secondary | ICD-10-CM | POA: Insufficient documentation

## 2015-02-25 DIAGNOSIS — R110 Nausea: Secondary | ICD-10-CM | POA: Insufficient documentation

## 2015-02-25 DIAGNOSIS — J3489 Other specified disorders of nose and nasal sinuses: Secondary | ICD-10-CM | POA: Insufficient documentation

## 2015-02-25 DIAGNOSIS — R519 Headache, unspecified: Secondary | ICD-10-CM

## 2015-02-25 DIAGNOSIS — Z79899 Other long term (current) drug therapy: Secondary | ICD-10-CM | POA: Insufficient documentation

## 2015-02-25 MED ORDER — ONDANSETRON 4 MG PO TBDP
4.0000 mg | ORAL_TABLET | Freq: Once | ORAL | Status: AC
  Administered 2015-02-25: 4 mg via ORAL
  Filled 2015-02-25: qty 1

## 2015-02-25 MED ORDER — OXYMETAZOLINE HCL 0.05 % NA SOLN
1.0000 | Freq: Once | NASAL | Status: AC
  Administered 2015-02-25: 1 via NASAL
  Filled 2015-02-25: qty 15

## 2015-02-25 MED ORDER — TRAMADOL HCL 50 MG PO TABS
50.0000 mg | ORAL_TABLET | Freq: Four times a day (QID) | ORAL | Status: DC | PRN
Start: 2015-02-25 — End: 2017-01-11

## 2015-02-25 MED ORDER — ONDANSETRON HCL 4 MG PO TABS: 4.0000 mg | ORAL_TABLET | Freq: Four times a day (QID) | ORAL | Status: DC

## 2015-02-25 MED ORDER — KETOROLAC TROMETHAMINE 60 MG/2ML IM SOLN
60.0000 mg | Freq: Once | INTRAMUSCULAR | Status: AC
  Administered 2015-02-25: 60 mg via INTRAMUSCULAR
  Filled 2015-02-25: qty 2

## 2015-02-25 NOTE — Discharge Instructions (Signed)
Sinus Headache °A sinus headache is when your sinuses become clogged or swollen. Sinus headaches can range from mild to severe.  °CAUSES °A sinus headache can have different causes, such as: °· Colds. °· Sinus infections. °· Allergies. °SYMPTOMS  °Symptoms of a sinus headache may vary and can include: °· Headache. °· Pain or pressure in the face. °· Congested or runny nose. °· Fever. °· Inability to smell. °· Pain in upper teeth. °Weather changes can make symptoms worse. °TREATMENT  °The treatment of a sinus headache depends on the cause. °· Sinus pain caused by a sinus infection may be treated with antibiotic medicine. °· Sinus pain caused by allergies may be helped by allergy medicines (antihistamines) and medicated nasal sprays. °· Sinus pain caused by congestion may be helped by flushing the nose and sinuses with saline solution. °HOME CARE INSTRUCTIONS  °· If antibiotics are prescribed, take them as directed. Finish them even if you start to feel better. °· Only take over-the-counter or prescription medicines for pain, discomfort, or fever as directed by your caregiver. °· If you have congestion, use a nasal spray to help reduce pressure. °SEEK IMMEDIATE MEDICAL CARE IF: °· You have a fever. °· You have headaches more than once a week. °· You have sensitivity to light or sound. °· You have repeated nausea and vomiting. °· You have vision problems. °· You have sudden, severe pain in your face or head. °· You have a seizure. °· You are confused. °· Your sinus headaches do not get better after treatment. Many people think they have a sinus headache when they actually have migraines or tension headaches. °MAKE SURE YOU:  °· Understand these instructions. °· Will watch your condition. °· Will get help right away if you are not doing well or get worse. °Document Released: 12/28/2004 Document Revised: 02/12/2012 Document Reviewed: 02/18/2011 °ExitCare® Patient Information ©2015 [employer]. This information is not  intended to replace advice given to you by your health care provider. Make sure you discuss any questions you have with your health care provider. ° °

## 2015-02-25 NOTE — ED Notes (Signed)
Pt states she has a sinus headache that has been going on four days now, c/o nausea no vomiting, denies photophobia, c/o coughing up yellow phlegm, water eyes, sneezing and coughing.

## 2015-02-25 NOTE — ED Provider Notes (Signed)
CSN: [id number]     Arrival date & time 02/25/15  0546 History   First MD Initiated Contact with Patient 02/25/15 [phone]     Chief Complaint  Patient presents with  . Headache     (Consider location/radiation/quality/duration/timing/severity/associated sxs/prior Treatment) HPI  Claudia Ball is a 34 year old female presenting to the ED with a chief complaint of headache x 4 days ago. She states that she woke up with a headache and it has not gone away. She states that it is unilateral, but switches sides. She describes the HA as constant pressure. Her pain is a 5/10 right now on the R side. She denies photophobia, and has slight phonophobia. She has some nausea, but no vomiting. Denies change in vision. No neck pain or stiffness out of ordinary for patient. She has tried taking [employer], Tylenol and Sudafed with no relief. She has had a sinus infection in the past and thinks this is similar, but the sinus infection was more severe in the past. She complains of cough with some yellow mucous production and drainage "stuck in her throat" and sinus pressure behind her eyes. Denies fever, runny nose, SOB, chest pain, abdominal pain, vomiting or diarrhea.    Past Medical History  Diagnosis Date  . Ectopic pregnancy   . Miscarriage    History reviewed. No pertinent past surgical history. No family history on file. History  Substance Use Topics  . Smoking status: Former Research scientist (life sciences)  . Smokeless tobacco: Not on file  . Alcohol Use: Yes     Comment: occ   OB History    No data available     Review of Systems  10 Systems reviewed and are negative for acute change except as noted in the HPI.    Allergies  Review of patient's allergies indicates no known allergies.  Home Medications   Prior to Admission medications   Medication Sig Start Date End Date Taking? Authorizing Provider  acyclovir (ZOVIRAX) 200 MG capsule Take 1 capsule by mouth 2 (two) times daily.    Historical Provider, MD   diphenhydrAMINE (BENADRYL) 25 mg capsule Take 50 mg by mouth at bedtime as needed for sleep.    Historical Provider, MD  ibuprofen (ADVIL,MOTRIN) 200 MG tablet Take 200 mg by mouth every 6 (six) hours as needed for pain (pain).    Historical Provider, MD  ondansetron (ZOFRAN) 4 MG tablet Take 1 tablet (4 mg total) by mouth every 6 (six) hours. 02/25/15   Tashona Calk Carlota Raspberry, PA-C  traMADol (ULTRAM) 50 MG tablet Take 1 tablet (50 mg total) by mouth every 6 (six) hours as needed. 02/25/15   Rimas Gilham Carlota Raspberry, PA-C   BP 129/88 mmHg  Pulse 87  Temp(Src) 97.8 F (36.6 C) (Oral)  Resp 18  Ht 4\' 11"  (1.499 m)  Wt 170 lb (77.111 kg)  BMI 34.32 kg/m2  SpO2 100%  LMP 02/18/2015 Physical Exam  Constitutional: She appears well-developed and well-nourished. No distress.  HENT:  Head: Normocephalic and atraumatic.  Right Ear: Tympanic membrane and ear canal normal.  Left Ear: Tympanic membrane and ear canal normal.  Nose: Right sinus exhibits maxillary sinus tenderness and frontal sinus tenderness. Left sinus exhibits no maxillary sinus tenderness and no frontal sinus tenderness.  Mouth/Throat: Uvula is midline and oropharynx is clear and moist.  Eyes: Pupils are equal, round, and reactive to light.  Neck: Normal range of motion. Neck supple.  Cardiovascular: Normal rate and regular rhythm.   Pulmonary/Chest: Effort normal and breath sounds normal.  She has no wheezes. She has no rhonchi.  Abdominal: Soft.  Neurological: She is alert.  Cranial nerves II-VIII and X-XII evaluated and show no deficits. Pt alert and oriented x 3 Upper and lower extremity strength is symmetrical and physiologic Normal muscular tone No facial droop Coordination intact  Skin: Skin is warm and dry.  Nursing note and vitals reviewed.   ED Course  Procedures (including critical care time) Labs Review Labs Reviewed - No data to display  Imaging Review No results found.   EKG Interpretation None      MDM   Final  diagnoses:  Sinus headache    Patient is well appearing- reports congestion in her sinuses have been draining causing cough and headache. Medications  ondansetron (ZOFRAN-ODT) disintegrating tablet 4 mg (4 mg Oral Given 02/25/15 0709)  ketorolac (TORADOL) injection 60 mg (60 mg Intramuscular Given 02/25/15 0711)  oxymetazoline (AFRIN) 0.05 % nasal spray 1 spray (1 spray Each Nare Given 02/25/15 0710)    Patient reports symptoms have improved to a 1/10. The Afrin spray given to her in the ED has significantly helped her sinuses, she reports being able to smell and breath now. Headache has also resolved. Coughing has improved. Patient appears to have sinusitis which is most likely the etiology of her headache. She will be able to keep the Afrin bottle given to her in the emergency department. I have discussed not using for more than 3 days and around and then needing to take 1 or 2 days off from the nasal spray. Otherwise she runs a risk of developing rebound.   Presentation is non concerning for [facility], ICH, Meningitis, or temporal arteritis. Pt is afebrile with no focal neuro deficits, nuchal rigidity, or change in vision. The patient denies any symptoms of neurological impairment or TIA's; no amaurosis, diplopia, dysphasia, or unilateral disturbance of motor or sensory function. No loss of balance or vertigo.  34 y.o.Claudia Ball's evaluation in the Emergency Department is complete. It has been determined that no acute conditions requiring further emergency intervention are present at this time. The patient/guardian have been advised of the diagnosis and plan. We have discussed signs and symptoms that warrant return to the ED, such as changes or worsening in symptoms.  Vital signs are stable at discharge. Filed Vitals:   02/25/15 0557  BP: 129/88  Pulse: 87  Temp: 97.8 F (36.6 C)  Resp: 18    Patient/guardian has voiced understanding and agreed to follow-up with the PCP or  specialist.     Delos Haring, PA-C 02/25/15 8295  Everlene Balls, MD 02/25/15 1721

## 2015-08-08 IMAGING — US US OB COMP LESS 14 WK
1 series · 14 of 28 positions shown · non-contrast
Comparison: None.

CLINICAL DATA: Early pregnancy.  Vaginal bleeding and pelvic
cramping.

OBSTETRIC <14 WK US AND TRANSVAGINAL OB US
TECHNIQUE: Both transabdominal and transvaginal ultrasound
examinations were performed for complete evaluation of the
gestation as well as the maternal uterus, adnexal regions, and
pelvic cul-de-sac.  Transvaginal technique was performed to assess
early pregnancy.

[Series 1: us ob comp less 14 wk · 0.18mm/px · 14 of 101 slices shown]
[im 4/101]
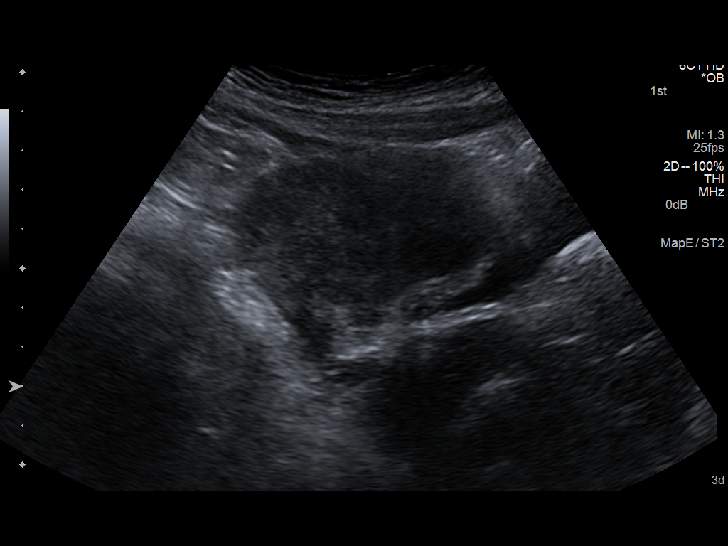
[im 12/101]
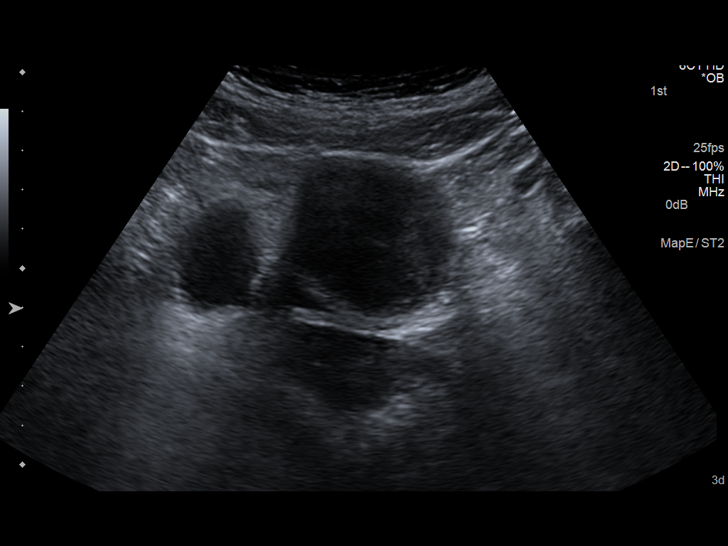
[im 19/101]
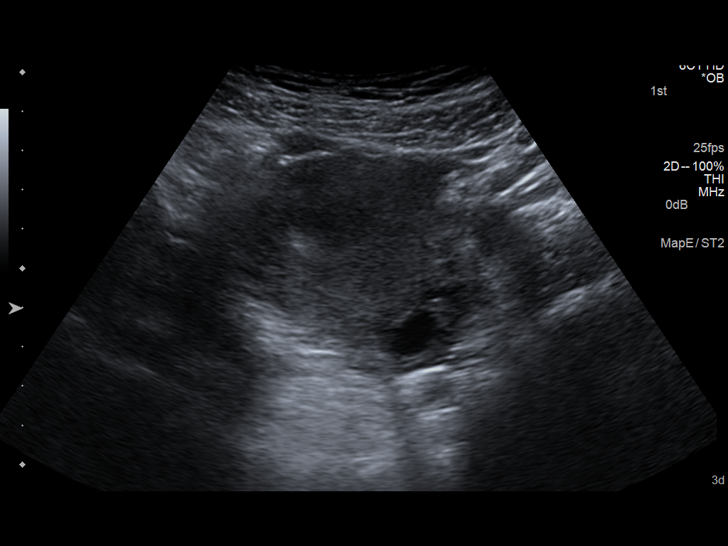
[im 26/101]
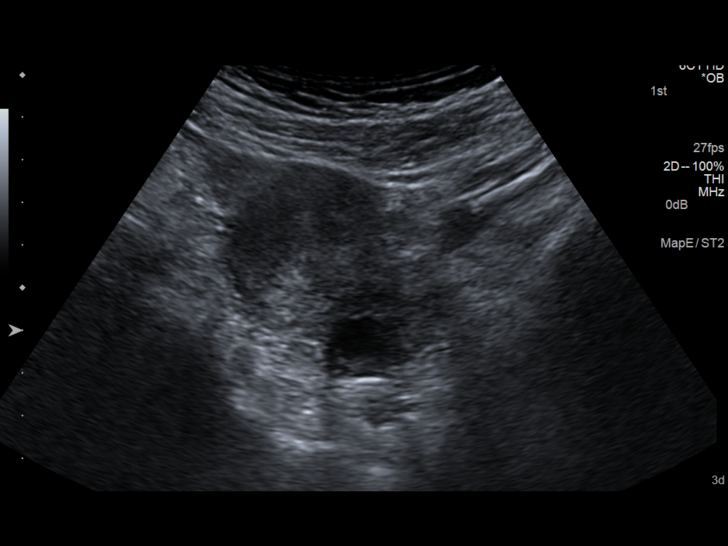
[im 34/101]
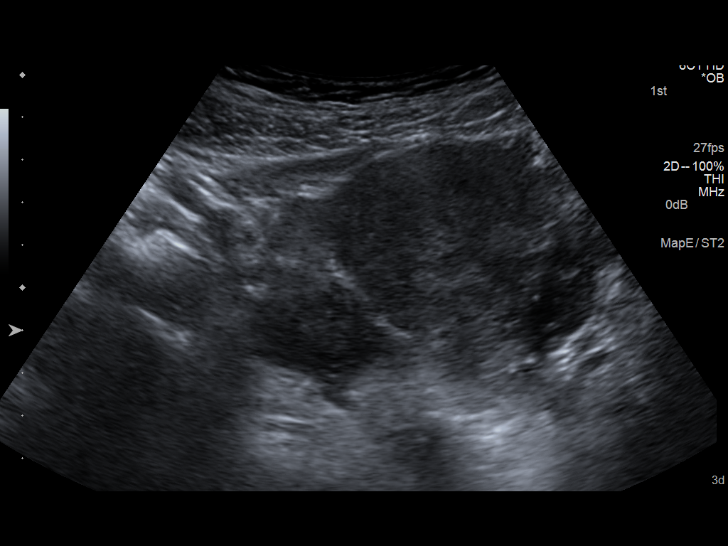
[im 41/101]
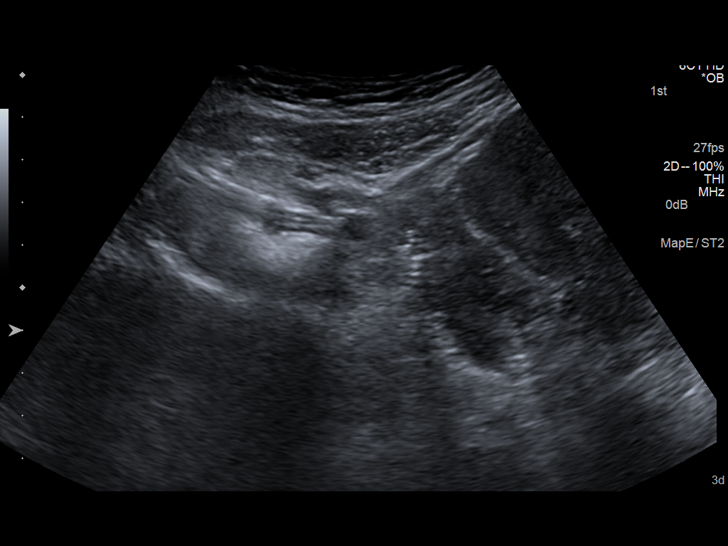
[im 49/101]
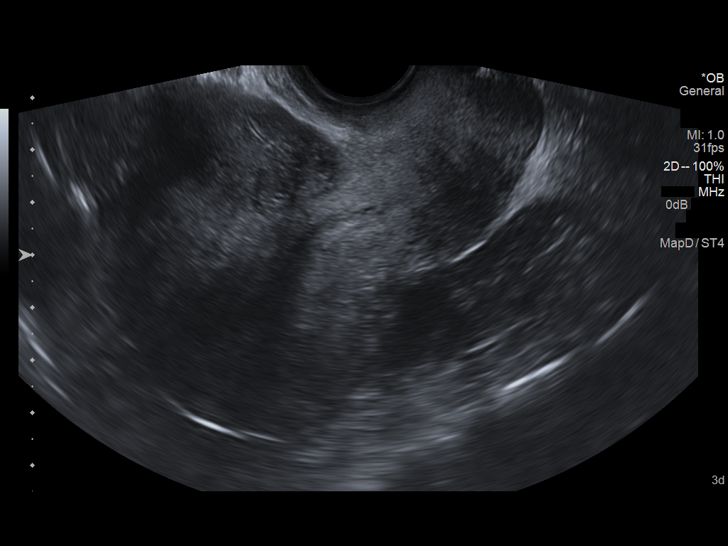
[im 56/101]
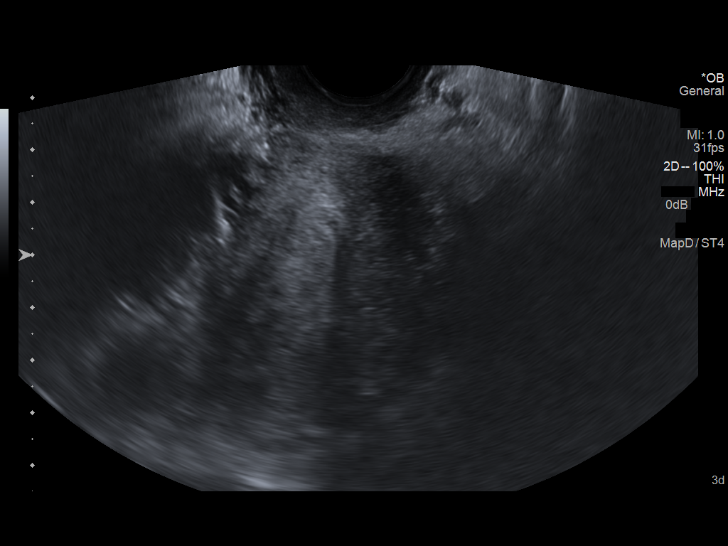
[im 63/101]
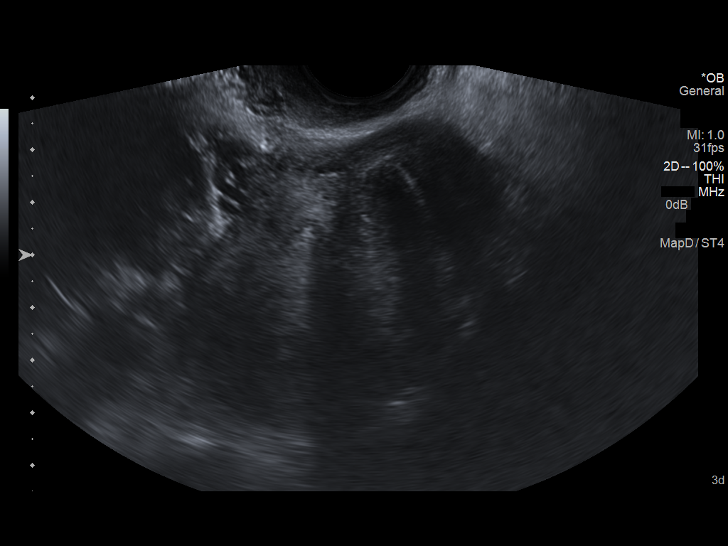
[im 71/101]
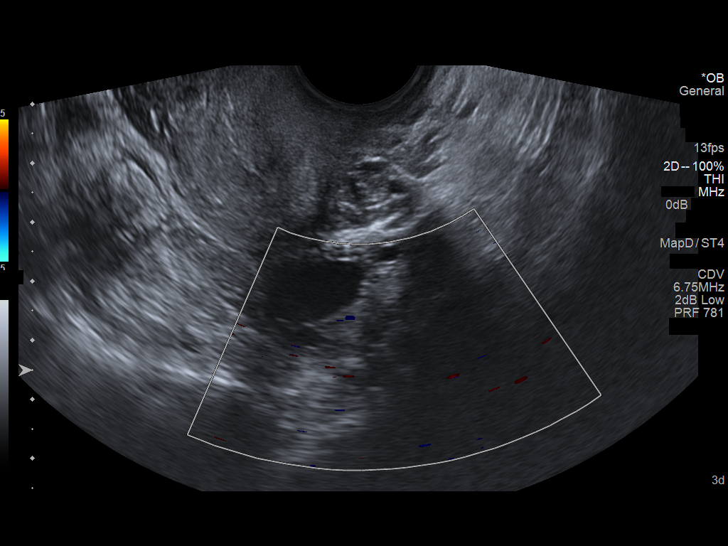
[im 78/101]
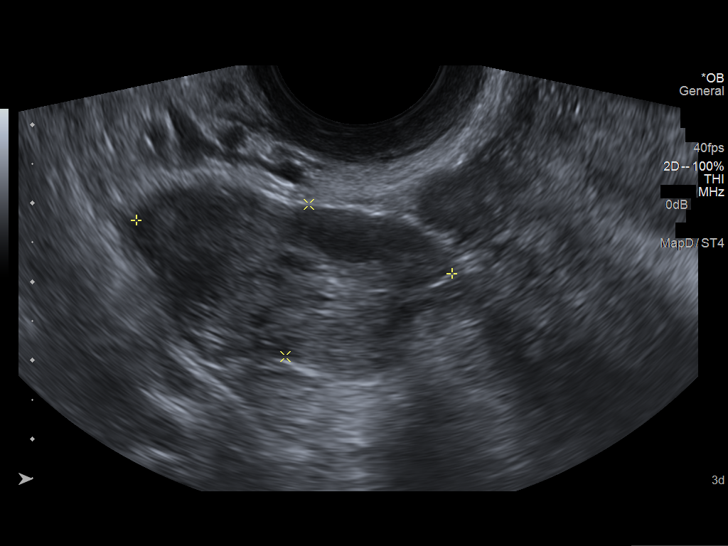
[im 86/101]
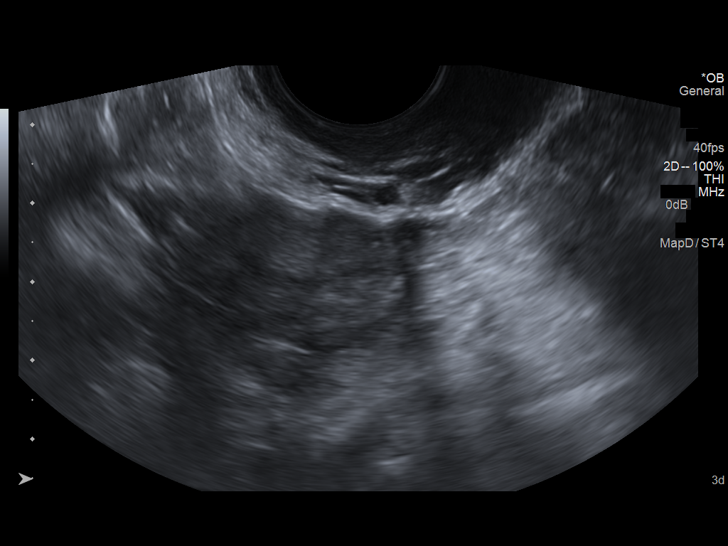
[im 93/101]
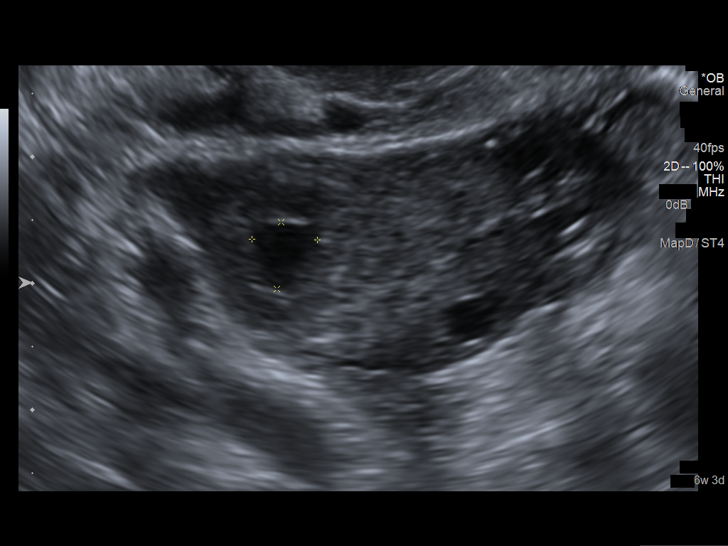
[im 101/101]
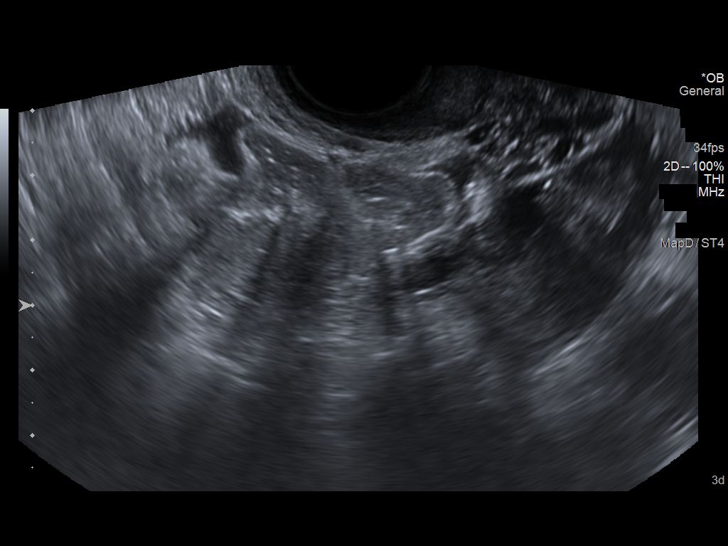

[14 of 28 positions shown; findings below may reference images not displayed]

Intrauterine gestational sac:  None
Yolk sac: None
Embryo: None
Cardiac Activity: None

Maternal uterus/adnexae:
There is a 5.0 x 4.1 x 4.4 cm fibroid in the body of the uterus
which obscures the endometrium in the fundus.

There is a 1.5 x 1.0 x 1.3 cm simple appearing cyst on the left
ovary.  There is a slightly complex 1.6 x 1.4 x 1.5 cm cyst in the
right ovary with some debris in the dependent portion of the cyst.
This probably represents a hemorrhagic cyst.

There is a small amount of free fluid in the pelvic cul-de-sac.
IMPRESSION: 1. No visible intrauterine gestational sac.
2.  Small cysts in both ovaries.
3.  No visible adnexal masses.
4.  5 cm fibroid in the uterus obscures the endometrium in the
fundus.

## 2015-09-21 ENCOUNTER — Emergency Department (HOSPITAL_COMMUNITY)
Admission: EM | Admit: 2015-09-21 | Discharge: 2015-09-21 | Disposition: A | Discharge status: Home / Self Care | Payer: PRIVATE HEALTH INSURANCE | Attending: Emergency Medicine | Admitting: Emergency Medicine

## 2015-09-21 ENCOUNTER — Emergency Department (HOSPITAL_COMMUNITY): Payer: PRIVATE HEALTH INSURANCE

## 2015-09-21 ENCOUNTER — Encounter (HOSPITAL_COMMUNITY): Payer: Self-pay | Admitting: Emergency Medicine

## 2015-09-21 DIAGNOSIS — R079 Chest pain, unspecified: Secondary | ICD-10-CM | POA: Diagnosis not present

## 2015-09-21 DIAGNOSIS — J329 Chronic sinusitis, unspecified: Secondary | ICD-10-CM

## 2015-09-21 DIAGNOSIS — Z79899 Other long term (current) drug therapy: Secondary | ICD-10-CM | POA: Insufficient documentation

## 2015-09-21 DIAGNOSIS — Z87891 Personal history of nicotine dependence: Secondary | ICD-10-CM | POA: Diagnosis not present

## 2015-09-21 DIAGNOSIS — J019 Acute sinusitis, unspecified: Secondary | ICD-10-CM | POA: Diagnosis not present

## 2015-09-21 DIAGNOSIS — R05 Cough: Secondary | ICD-10-CM | POA: Diagnosis present

## 2015-09-21 MED ORDER — DEXAMETHASONE 4 MG PO TABS
10.0000 mg | ORAL_TABLET | Freq: Once | ORAL | Status: AC
Start: 2015-09-21 — End: 2015-09-21
  Administered 2015-09-21: 10 mg via ORAL
  Filled 2015-09-21: qty 3

## 2015-09-21 MED ORDER — AMOXICILLIN-POT CLAVULANATE 875-125 MG PO TABS: 1.0000 | ORAL_TABLET | Freq: Two times a day (BID) | ORAL | Status: DC

## 2015-09-21 NOTE — ED Notes (Signed)
MD at bedside. 

## 2015-09-21 NOTE — Discharge Instructions (Signed)
Sinusitis, Adult  Take the antibiotics as prescribed.  Continue using over the counter medications as needed for symptom control.  Get lots of rest and drink plenty of water.  Do NOT smoke.  Follow up with your primary care doctor for reevaluation.  Sinusitis is redness, soreness, and inflammation of the paranasal sinuses. Paranasal sinuses are air pockets within the bones of your face. They are located beneath your eyes, in the middle of your forehead, and above your eyes. In healthy paranasal sinuses, mucus is able to drain out, and air is able to circulate through them by way of your nose. However, when your paranasal sinuses are inflamed, mucus and air can become trapped. This can allow bacteria and other germs to grow and cause infection. Sinusitis can develop quickly and last only a short time (acute) or continue over a long period (chronic). Sinusitis that lasts for more than 12 weeks is considered chronic. CAUSES Causes of sinusitis include:  Allergies.  Structural abnormalities, such as displacement of the cartilage that separates your nostrils (deviated septum), which can decrease the air flow through your nose and sinuses and affect sinus drainage.  Functional abnormalities, such as when the small hairs (cilia) that line your sinuses and help remove mucus do not work properly or are not present. SIGNS AND SYMPTOMS Symptoms of acute and chronic sinusitis are the same. The primary symptoms are pain and pressure around the affected sinuses. Other symptoms include:  Upper toothache.  Earache.  Headache.  Bad breath.  Decreased sense of smell and taste.  A cough, which worsens when you are lying flat.  Fatigue.  Fever.  Thick drainage from your nose, which often is green and may contain pus (purulent).  Swelling and warmth over the affected sinuses. DIAGNOSIS Your health care provider will perform a physical exam. During your exam, your health care provider may perform any  of the following to help determine if you have acute sinusitis or chronic sinusitis:  Look in your nose for signs of abnormal growths in your nostrils (nasal polyps).  Tap over the affected sinus to check for signs of infection.  View the inside of your sinuses using an imaging device that has a light attached (endoscope). If your health care provider suspects that you have chronic sinusitis, one or more of the following tests may be recommended:  Allergy tests.  Nasal culture. A sample of mucus is taken from your nose, sent to a lab, and screened for bacteria.  Nasal cytology. A sample of mucus is taken from your nose and examined by your health care provider to determine if your sinusitis is related to an allergy. TREATMENT Most cases of acute sinusitis are related to a viral infection and will resolve on their own within 10 days. Sometimes, medicines are prescribed to help relieve symptoms of both acute and chronic sinusitis. These may include pain medicines, decongestants, nasal steroid sprays, or saline sprays. However, for sinusitis related to a bacterial infection, your health care provider will prescribe antibiotic medicines. These are medicines that will help kill the bacteria causing the infection. Rarely, sinusitis is caused by a fungal infection. In these cases, your health care provider will prescribe antifungal medicine. For some cases of chronic sinusitis, surgery is needed. Generally, these are cases in which sinusitis recurs more than 3 times per year, despite other treatments. HOME CARE INSTRUCTIONS  Drink plenty of water. Water helps thin the mucus so your sinuses can drain more easily.  Use a humidifier.  Inhale  steam 3-4 times a day (for example, sit in the bathroom with the shower running).  Apply a warm, moist washcloth to your face 3-4 times a day, or as directed by your health care provider.  Use saline nasal sprays to help moisten and clean your sinuses.  Take  medicines only as directed by your health care provider.  If you were prescribed either an antibiotic or antifungal medicine, finish it all even if you start to feel better. SEEK IMMEDIATE MEDICAL CARE IF:  You have increasing pain or severe headaches.  You have nausea, vomiting, or drowsiness.  You have swelling around your face.  You have vision problems.  You have a stiff neck.  You have difficulty breathing.   This information is not intended to replace advice given to you by your health care provider. Make sure you discuss any questions you have with your health care provider.   Document Released: 11/20/2005 Document Revised: 12/11/2014 Document Reviewed: 12/05/2011 Elsevier Interactive Patient Education [employer].

## 2015-09-21 NOTE — ED Notes (Signed)
Pt reports productive cough, sneezing and head pressure x 1 month. Reports taking otc cold medications with no relief. Pt denies fever, reports some body aches.

## 2015-09-21 NOTE — ED Notes (Signed)
Back from Xray.

## 2015-09-21 NOTE — ED Provider Notes (Signed)
CSN: [id number]     Arrival date & time 09/21/15  6270 History   First MD Initiated Contact with Patient 09/21/15 [phone]     Chief Complaint  Patient presents with  . URI     (Consider location/radiation/quality/duration/timing/severity/associated sxs/prior Treatment) HPI Comments: 34 y.o. Female presents for cough, sinus pressure x1 month.  The patient states she has been drying over the counter medicines without relief.  She says that she initially was coughing up just clear mucus but it is now brownish in color.  No fever.  She has lots of pressure in her face.  She admits to being a social smoker and states that she used to smoke more.  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  She does report that over the last month in the morning and at night she has had soreness in her chest that she feels is made worse with coughing and she does not notice really during the day unless she has a bad coughing fit.  Patient is a 35 y.o. female presenting with URI.  URI Presenting symptoms: congestion, cough, fatigue and rhinorrhea   Presenting symptoms: no ear pain, no fever and no sore throat   Associated symptoms: sneezing   Associated symptoms: no headaches, no myalgias and no wheezing     Past Medical History  Diagnosis Date  . Ectopic pregnancy   . Miscarriage    History reviewed. No pertinent past surgical history. No family history on file. Social History  Substance Use Topics  . Smoking status: Former Research scientist (life sciences)  . Smokeless tobacco: None  . Alcohol Use: Yes     Comment: occ   OB History    No data available     Review of Systems  Constitutional: Positive for fatigue. Negative for fever and chills.  HENT: Positive for congestion, postnasal drip, rhinorrhea, sinus pressure and sneezing. Negative for ear discharge, ear pain and sore throat.   Respiratory: Positive for cough. Negative for chest tightness, shortness of breath and wheezing.   Cardiovascular: Positive for chest  pain (soreness). Negative for palpitations.  Gastrointestinal: Negative for nausea, vomiting, abdominal pain, diarrhea and constipation.  Genitourinary: Negative for flank pain.  Musculoskeletal: Negative for myalgias and back pain.  Skin: Negative for rash.  Neurological: Negative for dizziness, weakness and headaches.  Hematological: Does not bruise/bleed easily.      Allergies  Review of patient's allergies indicates no known allergies.  Home Medications   Prior to Admission medications   Medication Sig Start Date End Date Taking? Authorizing Provider  acyclovir (ZOVIRAX) 200 MG capsule Take 1 capsule by mouth 2 (two) times daily.    Historical Provider, MD  amoxicillin-clavulanate (AUGMENTIN) 875-125 MG tablet Take 1 tablet by mouth every 12 (twelve) hours. 09/21/15   Harvel Quale, MD  diphenhydrAMINE (BENADRYL) 25 mg capsule Take 50 mg by mouth at bedtime as needed for sleep.    Historical Provider, MD  ibuprofen (ADVIL,MOTRIN) 200 MG tablet Take 200 mg by mouth every 6 (six) hours as needed for pain (pain).    Historical Provider, MD  ondansetron (ZOFRAN) 4 MG tablet Take 1 tablet (4 mg total) by mouth every 6 (six) hours. 02/25/15   Tiffany Carlota Raspberry, PA-C  traMADol (ULTRAM) 50 MG tablet Take 1 tablet (50 mg total) by mouth every 6 (six) hours as needed. 02/25/15   Tiffany Carlota Raspberry, PA-C   BP 126/82 mmHg  Pulse 81  Temp(Src) 97.1 F (36.2 C) (Oral)  Resp 14  SpO2  99%  LMP 09/02/2015 (Approximate) Physical Exam  Constitutional: She is oriented to person, place, and time. She appears well-developed and well-nourished. No distress.  HENT:  Head: Normocephalic and atraumatic.  Right Ear: Tympanic membrane and external ear normal.  Left Ear: Tympanic membrane and external ear normal.  Nose: Right sinus exhibits maxillary sinus tenderness and frontal sinus tenderness. Left sinus exhibits maxillary sinus tenderness and frontal sinus tenderness.  Mouth/Throat: Uvula is midline and  oropharynx is clear and moist. Mucous membranes are not dry. No oropharyngeal exudate, posterior oropharyngeal edema or posterior oropharyngeal erythema.  Postnasal drip noted over the posterior pharynx.  Poor transillumination of the sinuses consistent with fullness and congestion  Eyes: EOM are normal. Pupils are equal, round, and reactive to light.  Neck: Normal range of motion. Neck supple.  Cardiovascular: Normal rate, regular rhythm, normal heart sounds and intact distal pulses.   No murmur heard. Pulmonary/Chest: Effort normal. No respiratory distress. She has no wheezes. She has no rales. She exhibits no tenderness.  Abdominal: Soft. She exhibits no distension. There is no tenderness.  Musculoskeletal: Normal range of motion. She exhibits no edema or tenderness.  Neurological: She is alert and oriented to person, place, and time.  Skin: Skin is warm and dry. No rash noted. She is not diaphoretic.  Vitals reviewed.   ED Course  Procedures (including critical care time) Labs Review Labs Reviewed - No data to display  Imaging Review Dg Chest 2 View  09/21/2015  CLINICAL DATA:  Cough for 4 weeks with flu symptoms. EXAM: CHEST  2 VIEW COMPARISON:  None. FINDINGS: Normal heart size and mediastinal contours. No acute infiltrate or edema. No effusion or pneumothorax. No acute osseous findings. IMPRESSION: Negative chest Electronically Signed   By: Monte Fantasia M.D.   On: 09/21/2015 07:58   I have personally reviewed and evaluated these images and lab results as part of my medical decision-making.   EKG Interpretation   Date/Time:  Tuesday September 21 2015 07:35:01 EDT Ventricular Rate:  89 PR Interval:  137 QRS Duration: 76 QT Interval:  359 QTC Calculation: 437 R Axis:   16 Text Interpretation:  Sinus rhythm Unremarkable EKG No previous ECGs  available Confirmed by Ronith Berti ([id number]) on 09/21/2015 7:39:49 AM      MDM  Patient seen and evaluated in stable condition.   Physical examination and history most consistent with sinusitis.  Chest xray and EKG unremarkable.  Patient given dose of Decadron.  In light of duration of symptoms and patient's smoking status patient given prescription for Augmentin.  Patient discharged home in stable condition with instructions on supportive care.  All questions answered prior to discharge. Final diagnoses:  Sinusitis, unspecified chronicity, unspecified location    1. Sinusitis    Harvel Quale, MD 09/21/15 [phone]

## 2016-01-07 ENCOUNTER — Encounter (HOSPITAL_COMMUNITY): Payer: Self-pay

## 2016-01-07 ENCOUNTER — Emergency Department (HOSPITAL_COMMUNITY)
Admission: EM | Admit: 2016-01-07 | Discharge: 2016-01-07 | Disposition: A | Discharge status: Home / Self Care | Payer: PRIVATE HEALTH INSURANCE | Attending: Emergency Medicine | Admitting: Emergency Medicine

## 2016-01-07 DIAGNOSIS — J019 Acute sinusitis, unspecified: Secondary | ICD-10-CM | POA: Insufficient documentation

## 2016-01-07 DIAGNOSIS — Z87891 Personal history of nicotine dependence: Secondary | ICD-10-CM | POA: Insufficient documentation

## 2016-01-07 DIAGNOSIS — N390 Urinary tract infection, site not specified: Secondary | ICD-10-CM

## 2016-01-07 DIAGNOSIS — Z79899 Other long term (current) drug therapy: Secondary | ICD-10-CM | POA: Insufficient documentation

## 2016-01-07 LAB — URINE MICROSCOPIC-ADD ON: RBC / HPF: NONE SEEN RBC/hpf (ref 0–5)

## 2016-01-07 LAB — URINALYSIS, ROUTINE W REFLEX MICROSCOPIC
Bilirubin Urine: NEGATIVE
Glucose, UA: NEGATIVE mg/dL
Hgb urine dipstick: NEGATIVE
Ketones, ur: NEGATIVE mg/dL
NITRITE: NEGATIVE
PROTEIN: NEGATIVE mg/dL
SPECIFIC GRAVITY, URINE: 1.021 (ref 1.005–1.030)
pH: 7 (ref 5.0–8.0)

## 2016-01-07 MED ORDER — AMOXICILLIN-POT CLAVULANATE 875-125 MG PO TABS: 1.0000 | ORAL_TABLET | Freq: Two times a day (BID) | ORAL | Status: DC

## 2016-01-07 MED ORDER — CEPHALEXIN 500 MG PO CAPS: 500.0000 mg | ORAL_CAPSULE | Freq: Two times a day (BID) | ORAL | Status: DC

## 2016-01-07 MED ORDER — OXYMETAZOLINE HCL 0.05 % NA SOLN: 1.0000 | Freq: Two times a day (BID) | NASAL | Status: DC

## 2016-01-07 NOTE — ED Provider Notes (Signed)
CSN: [id number]     Arrival date & time 01/07/16  1206 History  By signing my name below, I, Rayna Sexton, attest that this documentation has been prepared under the direction and in the presence of Nona Dell, [place]. Electronically Signed: Rayna Sexton, ED Scribe. 01/07/2016. 12:55 PM.    Chief Complaint  Patient presents with  . Nasal Congestion  . Cough   The history is provided by the patient. No language interpreter was used.    HPI Comments: Nataki Arts is a 35 y.o. female who presents to the Emergency Department complaining of worsening moderate sinus and chest congestion onset 8 days. Pt reports associated sneezing, worsening productive cough with yellow sputum onset 3 days ago, dysuria, intermittent nausea, sinus pressure and diffuse body aches that worsen with coughing. She took Sudafed and Tylenol Cold and Sinus and experienced no relief with either. Pt had a sick contact in her household recently with GI symptoms. Pt denies fevers, neck stiffness, sore throat, ear pain, SOB, wheezing, CP, abd pain, diarrhea, vomiting or any other associated symptoms at this time.    Past Medical History  Diagnosis Date  . Ectopic pregnancy   . Miscarriage    History reviewed. No pertinent past surgical history. History reviewed. No pertinent family history. Social History  Substance Use Topics  . Smoking status: Former Research scientist (life sciences)  . Smokeless tobacco: None  . Alcohol Use: Yes     Comment: occ   OB History    No data available     Review of Systems  All other systems reviewed and are negative.   Allergies  Review of patient's allergies indicates no known allergies.  Home Medications   Prior to Admission medications   Medication Sig Start Date End Date Taking? Authorizing Provider  acyclovir (ZOVIRAX) 200 MG capsule Take 1 capsule by mouth 2 (two) times daily.    Historical Provider, MD  amoxicillin-clavulanate (AUGMENTIN) 875-125 MG tablet Take 1 tablet by mouth  every 12 (twelve) hours. 01/07/16   Nona Dell, PA-C  cephALEXin (KEFLEX) 500 MG capsule Take 1 capsule (500 mg total) by mouth 2 (two) times daily. 01/07/16   Nona Dell, PA-C  diphenhydrAMINE (BENADRYL) 25 mg capsule Take 50 mg by mouth at bedtime as needed for sleep.    Historical Provider, MD  ibuprofen (ADVIL,MOTRIN) 200 MG tablet Take 200 mg by mouth every 6 (six) hours as needed for pain (pain).    Historical Provider, MD  ondansetron (ZOFRAN) 4 MG tablet Take 1 tablet (4 mg total) by mouth every 6 (six) hours. 02/25/15   Tiffany Carlota Raspberry, PA-C  oxymetazoline (AFRIN NASAL SPRAY) 0.05 % nasal spray Place 1 spray into both nostrils 2 (two) times daily. 01/07/16   Nona Dell, PA-C  traMADol (ULTRAM) 50 MG tablet Take 1 tablet (50 mg total) by mouth every 6 (six) hours as needed. 02/25/15   Tiffany Carlota Raspberry, PA-C   BP 133/78 mmHg  Pulse 94  Temp(Src) 98.5 F (36.9 C) (Oral)  Resp 16  SpO2 100%  LMP 12/24/2015    Physical Exam  Constitutional: She is oriented to person, place, and time. She appears well-developed and well-nourished.  HENT:  Head: Normocephalic and atraumatic.  Right Ear: Tympanic membrane normal.  Left Ear: Tympanic membrane normal.  Nose: Rhinorrhea present. Right sinus exhibits maxillary sinus tenderness and frontal sinus tenderness. Left sinus exhibits maxillary sinus tenderness and frontal sinus tenderness.  Mouth/Throat: Uvula is midline, oropharynx is clear and moist and mucous membranes are  normal. No oropharyngeal exudate, posterior oropharyngeal edema, posterior oropharyngeal erythema or tonsillar abscesses.  Eyes: Conjunctivae and EOM are normal. Right eye exhibits no discharge. Left eye exhibits no discharge. No scleral icterus.  Neck: Normal range of motion. Neck supple.  Cardiovascular: Normal rate, regular rhythm and normal heart sounds.  Exam reveals no gallop and no friction rub.   No murmur heard. Pulmonary/Chest: Effort  normal and breath sounds normal. No respiratory distress. She has no wheezes. She has no rales. She exhibits no tenderness.  Abdominal: Soft. Bowel sounds are normal. She exhibits no distension and no mass. There is no tenderness. There is no rebound and no guarding.  Musculoskeletal: Normal range of motion.  Lymphadenopathy:    She has no cervical adenopathy.  Neurological: She is alert and oriented to person, place, and time.  Skin: Skin is warm and dry.  Psychiatric: She has a normal mood and affect.  Nursing note and vitals reviewed.   ED Course  Procedures  DIAGNOSTIC STUDIES: Oxygen Saturation is 100% on RA, normal by my interpretation.    COORDINATION OF CARE: 12:30 PM Discussed next steps for treatment including UA, Afrin, Amoxicillin and reevaluation based on results of UA. Pt understood and is agreeable with the plan.   Labs Review Labs Reviewed  URINALYSIS, ROUTINE W REFLEX MICROSCOPIC (NOT AT [facility]) - Abnormal; Notable for the following:    APPearance CLOUDY (*)    Leukocytes, UA SMALL (*)    All other components within normal limits  URINE MICROSCOPIC-ADD ON - Abnormal; Notable for the following:    Squamous Epithelial / LPF 6-30 (*)    Bacteria, UA FEW (*)    All other components within normal limits  URINE CULTURE    Imaging Review No results found.  I personally reviewed and evaluated these lab results as part of my medical decision-making.  Filed Vitals:   01/07/16 1212  BP: 133/78  Pulse: 94  Temp: 98.5 F (36.9 C)  Resp: 16     MDM   Final diagnoses:  Acute sinusitis, recurrence not specified, unspecified location  UTI (lower urinary tract infection)    Pt symptoms consistent with acute sinusitis. Due to sxs worsening over the past week, pt will be discharged with antibiotics to tx for suspected bacterial sinusitis and symptomatic treatment. UA revealed UTI. Will send pt home with antibiotics to tx UTI. Discussed return precautions.  Pt is  hemodynamically stable & in NAD prior to discharge. Patient given resource guide to follow up with PCP.  I personally performed the services described in this documentation, which was scribed in my presence. The recorded information has been reviewed and is accurate.    Chesley Noon [place], [place] 01/07/16 [place], MD 01/10/16 [phone]

## 2016-01-07 NOTE — ED Notes (Signed)
Pt states for past week she has had congestion and cough.  Using over the counter meds without relief.  Sinus pressure noted.  No fever.  Nausea with no vomiting.  Diet wnl.

## 2016-01-07 NOTE — Discharge Instructions (Signed)
Take your medications as prescribed. I also recommend continuing to drink fluids to remain hydrated. Please follow up with a primary care provider from the Resource Guide provided below in 5 days. Please return to the Emergency Department if symptoms worsen or new onset of fever, shortness of breath, wheezing, chest pain, abdominal pain, vomiting.   Emergency Department Resource Guide 1) Find a Doctor and Pay Out of Pocket Although you won't have to find out who is covered by your insurance plan, it is a good idea to ask around and get recommendations. You will then need to call the office and see if the doctor you have chosen will accept you as a new patient and what types of options they offer for patients who are self-pay. Some doctors offer discounts or will set up payment plans for their patients who do not have insurance, but you will need to ask so you aren't surprised when you get to your appointment.  2) Contact Your Local Health Department Not all health departments have doctors that can see patients for sick visits, but many do, so it is worth a call to see if yours does. If you don't know where your local health department is, you can check in your phone book. The CDC also has a tool to help you locate your state's health department, and many state websites also have listings of all of their local health departments.  3) Find a [facility] If your illness is not likely to be very severe or complicated, you may want to try a walk in clinic. These are popping up all over the country in pharmacies, drugstores, and shopping centers. They're usually staffed by nurse practitioners or physician assistants that have been trained to treat common illnesses and complaints. They're usually fairly quick and inexpensive. However, if you have serious medical issues or chronic medical problems, these are probably not your best option.  No Primary Care Doctor: - Call Health Connect at  [phone] - they  can help you locate a primary care doctor that  accepts your insurance, provides certain services, etc. - Physician Referral Service- [phone]  Chronic Pain Problems: Organization         Address  Phone   Notes  [facility]  [phone] Patients need to be referred by their primary care doctor.   Medication Assistance: Organization         Address  Phone   Notes  [facility] Medication [facility] [address] [place], [place] [address] [phone] --Must be a resident of [facility] -- Must have NO insurance coverage whatsoever (no Medicaid/ Medicare, etc.) -- The pt. MUST have a primary care doctor that directs their care regularly and follows them in the community   Med[facility]ist  [phone]   [employer]  [phone]    Agencies that provide inexpensive medical care: Organization         Address  Phone   Notes  [place]  [phone]   [facility]    [phone]   [facility] [place], [place] [address] [phone]   [place] [address] [place] [phone]   Planned Parenthood    [phone]   [facility]    [phone]   [place] and [place], [place] Phone:  [phone], Fax:  [phone]  Hours of Operation:  9 am - 6 pm, M-F.  Also accepts Medicaid/Medicare and self-pay.  [facility] for [place], [address], [place] Phone: [phone], Fax: [phone]. Hours of Operation:  8:30 am - 5:30 pm, M-F.  Also accepts Medicaid and self-pay.  [facility] [place] [address] [place] Phone: [phone]   [place], [place], [place] [phone], Ext. [phone] Mondays & Thursdays: 7-9 AM.  First 15 patients are seen on a first come, first serve basis.    [place] Providers:  Organization         Address  Phone   Notes  [facility] [address], [place] [phone] Also accepts self-pay patients.  [facility] [id number] [place], [place]  [phone]   [place], [address], [place] [phone]   [facility] [address] [place] [phone]   Lucianne Lei [address], [place]   [phone] Only accepts [place] Access [place] patients after they have their name applied to their card.   Self-Pay (no insurance) in [facility]:  Organization         Address  Phone   Notes  Sickle Cell Patients, [facility] [place] [phone]   [facility] [place] [phone]   [facility] [place]  [place], [place], [place] [phone]   [facility]/Dr. Osei-Bonsu  [address] [place] or [address], [place] [phone] Phone number for both [place] and [employer] locations is the same.  Urgent Medical and [facility] [address], [place] [phone]   [facility] [address], [place] or [address] [phone]   [facility] [address], [place] [phone], phone; [phone], fax Sees patients 1st and 3rd Saturday of every month.  Must not qualify for public or private insurance (i.e. Medicaid, Medicare, [place] Health Choice, Veterans' Benefits)  Household income should be no more than 200% of the poverty level The clinic cannot treat you if you are pregnant or think you are pregnant  Sexually transmitted diseases are not treated at the clinic.    Dental Care: Organization         Address  Phone  Notes  [facility] Department of [facility] [place] [phone] Accepts children up to age 43 who are enrolled in [place] or [place]; pregnant women with a Medicaid card; and children who have applied for Medicaid or [place] Health Choice, but were declined, whose parents can pay a reduced fee at time of service.  [facility] Department of [facility]  [address], [place] [phone] Accepts children up to age 45 who are enrolled in [place] or [place]; pregnant women with a Medicaid card; and children who have applied for Medicaid or [place] Health Choice, but were declined, whose parents can pay a reduced fee at time of service.  [place] Adult Dental Access PROGRAM  [place] [phone] Patients are seen by appointment only. Walk-ins are not accepted. [place] will  see patients 42 years of age and older. Monday - Tuesday (8am-5pm) Most Wednesdays (8:30-5pm) $30 per visit, cash only  [facility] Adult Dental Access PROGRAM  [address], [facility] [phone] Patients are seen by appointment only. Walk-ins are not accepted. [place] will see patients 35 years of age and older. One Wednesday Evening (Monthly: Volunteer Based).  $30 per visit, cash only  [place]  [phone] for adults; Children under age 10, call Graduate Pediatric Dentistry at [phone]. Children aged 32-14, please call [phone] to request a pediatric application.  Dental services are provided in all areas of dental care including fillings, crowns and bridges, complete and partial dentures, implants, gum treatment, root canals, and extractions. Preventive care is also provided. Treatment is provided to both adults and children. Patients are selected via a lottery and there is often a waiting list.   [facility] [address], [place]  [phone] [email or web address]   [facility]  [facility] [address] [place], [place] [phone], Ext. [phone] Second and Fourth Thursday of each month, opens at 6:30 AM; Clinic ends at 9 AM.  Patients are seen on a first-come first-served basis, and a limited number are seen during each clinic.   [facility]  [address] Hillard Danker [place], [place] [phone]   Eligibility Requirements You must have lived in [place], [place], or [place] counties for at least the last three months.   You cannot be eligible for state or federal sponsored [employer], including [employer], [place], or [employer].   You generally cannot be eligible for healthcare insurance through your employer.    How to apply: Eligibility screenings are held every Tuesday and Wednesday afternoon from 1:00 pm until 4:00 pm. You do not need an appointment for the interview!  [facility] [address], [place], [place]   [place]  [place] Department  [place]  [phone]    Behavioral Health Resources in the Community: Intensive Outpatient Programs Organization         Address  Phone  Notes  [place] [facility]. [address], [place], [place] [phone]   [address] [facility] Outpatient [address], [place], [place]   ADS: [facility] [address] [place], [place]   [place] [address],  [place], [place] or [phone]   Substance Abuse Resources Organization         Address  Phone  Notes  Alcohol and Drug Services  [phone]   [place]  [phone]   [facility] [place]   Chinita Pester  [phone]   Residential & Outpatient Substance Abuse Program  [phone]   Psychological Services Organization         Address  Phone  Notes  [facility]   [place]  [phone]   [place] [address], [place] or [phone]    Mobile Crisis Teams Organization         Address  Phone  Notes  [facility], Mobile Crisis Care Unit  [phone]   [facility]ive Psychotherapeutic Services  [address] [place], [place]   [facility] [address] [place] [phone]    Self-Help/Support Groups Organization  Address  Phone             Notes  [place]. of [place] - variety of support groups  [place] Call for more information  Narcotics Anonymous (NA), [facility] [address], [employer] [place]  2 meetings at this location   Special educational needs teacher         Address  Phone  Notes  ASAP Residential Treatment [place],    [place]  [phone]   [facility]  [address], [place] [phone], [place], [place]   [place], [place] [phone] Admissions: 8am-3pm M-F  Incentives Substance [place] [address]    [place], [place] [phone]   [facility] [address] [place], [place], [place]   [facility] [address],  [place], [place]   [facility] - Intensive Outpatient [address] Kristeen Mans [address], [place], [place]   [facility] [address]  [place], [place] [phone] or [phone]   [facility] Services (RTS) [address], [place], [place] Accepts Medicaid  [facility] [place] [address]  [place] [phone] Substance Abuse/Addiction Treatment   [facility] Organization         Address  Phone  Notes  [facility]  [phone]   Domenic Schwab, PhD [address] Arlis Porta [place], [place]   [phone] or  [phone]   [place], [place] [phone]   [facility] [address], [place], [place] [phone] Insurance/Medicaid/sponsorship through [facility] and Families [address]                                    [place], [place] [phone] [place] [address], [place] [phone]    Dr. Adele Schilder  [phone]   [facility] Dept. 1) [address], [place] 2) [place] 3)  [place] [address], [place] [phone]  [phone]   [place] [phone] or [phone] (After Hours)

## 2016-01-09 LAB — URINE CULTURE: SPECIAL REQUESTS: NORMAL

## 2016-01-12 ENCOUNTER — Telehealth: Payer: Self-pay | Admitting: *Deleted

## 2016-01-12 NOTE — Telephone Encounter (Signed)
Pharmacy called related to Rx: amoxicillin-clavulanate (AUGMENTIN) 875-125 MG tablet .Marland KitchenMarland KitchenEDCM clarified with EDP to change Rx to:  Amoxicillin capsules as it is on $4 list and more affordable for pt.

## 2016-02-17 ENCOUNTER — Encounter (HOSPITAL_COMMUNITY): Payer: Self-pay

## 2016-02-17 ENCOUNTER — Emergency Department (HOSPITAL_COMMUNITY)
Admission: EM | Admit: 2016-02-17 | Discharge: 2016-02-18 | Disposition: A | Discharge status: Home / Self Care | Payer: PRIVATE HEALTH INSURANCE | Attending: Emergency Medicine | Admitting: Emergency Medicine

## 2016-02-17 DIAGNOSIS — I10 Essential (primary) hypertension: Secondary | ICD-10-CM | POA: Insufficient documentation

## 2016-02-17 DIAGNOSIS — Z792 Long term (current) use of antibiotics: Secondary | ICD-10-CM | POA: Insufficient documentation

## 2016-02-17 DIAGNOSIS — Z87891 Personal history of nicotine dependence: Secondary | ICD-10-CM | POA: Insufficient documentation

## 2016-02-17 DIAGNOSIS — R51 Headache: Secondary | ICD-10-CM | POA: Insufficient documentation

## 2016-02-17 DIAGNOSIS — Z79899 Other long term (current) drug therapy: Secondary | ICD-10-CM | POA: Insufficient documentation

## 2016-02-17 HISTORY — DX: Essential (primary) hypertension: I10

## 2016-02-17 LAB — I-STAT CHEM 8, ED
BUN: 8 mg/dL (ref 6–20)
CHLORIDE: 105 mmol/L (ref 101–111)
Calcium, Ion: 1.15 mmol/L (ref 1.12–1.23)
Creatinine, Ser: 0.8 mg/dL (ref 0.44–1.00)
Glucose, Bld: 88 mg/dL (ref 65–99)
HCT: 39 % (ref 36.0–46.0)
Hemoglobin: 13.3 g/dL (ref 12.0–15.0)
Potassium: 3.6 mmol/L (ref 3.5–5.1)
SODIUM: 141 mmol/L (ref 135–145)
TCO2: 23 mmol/L (ref 0–100)

## 2016-02-17 MED ORDER — ACETAMINOPHEN 325 MG PO TABS
650.0000 mg | ORAL_TABLET | Freq: Once | ORAL | Status: AC
  Administered 2016-02-17: 650 mg via ORAL
  Filled 2016-02-17: qty 2

## 2016-02-17 MED ORDER — LISINOPRIL 20 MG PO TABS: 20.0000 mg | ORAL_TABLET | Freq: Every day | ORAL | Status: DC

## 2016-02-17 NOTE — ED Provider Notes (Signed)
CSN: [id number]     Arrival date & time 02/17/16  1544 History   First MD Initiated Contact with Patient 02/17/16 2024     Chief Complaint  Patient presents with  . Hypertension   HPI Pt has history of HTN.  SHe stoppped taking medications months ago when she stopped going to her doctor.   She used to take lisinopril , 25 mg? Twice daily?  She was at work and felt dizzy.  They took her blood pressure and it was elevated so they told her to get evaluated.  No trouble with speech.  No weakness.  No chest pain or shortness of breath.  She is feeling better now but has a headache still. Past Medical History  Diagnosis Date  . Ectopic pregnancy   . Miscarriage   . Hypertension    Past Surgical History  Procedure Laterality Date  . Ectopic pregnancy surgery  2015   No family history on file. Social History  Substance Use Topics  . Smoking status: Former Research scientist (life sciences)  . Smokeless tobacco: None  . Alcohol Use: Yes     Comment: occ   OB History    No data available     Review of Systems  All other systems reviewed and are negative.     Allergies  Review of patient's allergies indicates no known allergies.  Home Medications   Prior to Admission medications   Medication Sig Start Date End Date Taking? Authorizing Provider  acyclovir (ZOVIRAX) 200 MG capsule Take 1 capsule by mouth 2 (two) times daily.   Yes Historical Provider, MD  Multiple Vitamins-Minerals (MULTIVITAMIN WITH MINERALS) tablet Take 1 tablet by mouth daily.   Yes Historical Provider, MD  oxymetazoline (AFRIN NASAL SPRAY) 0.05 % nasal spray Place 1 spray into both nostrils 2 (two) times daily. 01/07/16  Yes Chesley Noon Nadeau, PA-C  amoxicillin-clavulanate (AUGMENTIN) 875-125 MG tablet Take 1 tablet by mouth every 12 (twelve) hours. 01/07/16   Nona Dell, PA-C  cephALEXin (KEFLEX) 500 MG capsule Take 1 capsule (500 mg total) by mouth 2 (two) times daily. 01/07/16   Nona Dell, PA-C   diphenhydrAMINE (BENADRYL) 25 mg capsule Take 50 mg by mouth at bedtime as needed for sleep.    Historical Provider, MD  ibuprofen (ADVIL,MOTRIN) 200 MG tablet Take 200 mg by mouth every 6 (six) hours as needed for pain (pain).    Historical Provider, MD  lisinopril (PRINIVIL,ZESTRIL) 20 MG tablet Take 1 tablet (20 mg total) by mouth daily. 02/17/16   Dorie Rank, MD  ondansetron (ZOFRAN) 4 MG tablet Take 1 tablet (4 mg total) by mouth every 6 (six) hours. 02/25/15   Tiffany Carlota Raspberry, PA-C  traMADol (ULTRAM) 50 MG tablet Take 1 tablet (50 mg total) by mouth every 6 (six) hours as needed. 02/25/15   Tiffany Carlota Raspberry, PA-C   BP 152/89 mmHg  Pulse 80  Temp(Src) 98.7 F (37.1 C) (Oral)  Resp 20  Ht 4\' 11"  (1.499 m)  Wt 82.81 kg  BMI 36.85 kg/m2  SpO2 100%  LMP 02/08/2016 Physical Exam  Constitutional: She appears well-developed and well-nourished. No distress.  HENT:  Head: Normocephalic and atraumatic.  Right Ear: External ear normal.  Left Ear: External ear normal.  Eyes: Conjunctivae are normal. Right eye exhibits no discharge. Left eye exhibits no discharge. No scleral icterus.  Neck: Neck supple. No tracheal deviation present.  Cardiovascular: Normal rate, regular rhythm and intact distal pulses.   Pulmonary/Chest: Effort normal and breath sounds normal. No  stridor. No respiratory distress. She has no wheezes. She has no rales.  Abdominal: Soft. Bowel sounds are normal. She exhibits no distension. There is no tenderness. There is no rebound and no guarding.  Musculoskeletal: She exhibits no edema or tenderness.  Neurological: She is alert. She has normal strength. No cranial nerve deficit (no facial droop, extraocular movements intact, no slurred speech) or sensory deficit. She exhibits normal muscle tone. She displays no seizure activity. Coordination normal.  Skin: Skin is warm and dry. No rash noted.  Psychiatric: She has a normal mood and affect.  Nursing note and vitals  reviewed.   ED Course  Procedures (including critical care time) Labs Review Labs Reviewed  I-STAT CHEM 8, ED    MDM   Final diagnoses:  Essential hypertension    Pt has history of HTN.  She stopped taking her medications.    Pt thinks she used to take lisinopril 20 mg.  Normal exam in the ED  Will give her a refill of her lisinopril, encouraged follow up with a PCP.    Dorie Rank, MD 02/17/16 2252

## 2016-02-17 NOTE — ED Notes (Signed)
Will PA at bedside.  

## 2016-02-17 NOTE — ED Notes (Signed)
Pt states she took her BP this afternoon at work and it was 138/115. She has been diagnosed with HTN but does not have PCP anymore and no longer takes her Lisinopril, last dose was months ago. BP is in triage is 154/104 and she endorses headache and dizziness but denies blurry vision. Pt A&Ox4, ambulatory with steady gait.

## 2016-02-17 NOTE — Discharge Instructions (Signed)
Hypertension Hypertension, commonly called high blood pressure, is when the force of blood pumping through your arteries is too strong. Your arteries are the blood vessels that carry blood from your heart throughout your body. A blood pressure reading consists of a higher number over a lower number, such as 110/72. The higher number (systolic) is the pressure inside your arteries when your heart pumps. The lower number (diastolic) is the pressure inside your arteries when your heart relaxes. Ideally you want your blood pressure below 120/80. Hypertension forces your heart to work harder to pump blood. Your arteries may become narrow or stiff. Having untreated or uncontrolled hypertension can cause heart attack, stroke, kidney disease, and other problems. RISK FACTORS Some risk factors for high blood pressure are controllable. Others are not.  Risk factors you cannot control include:   Race. You may be at higher risk if you are African American.  Age. Risk increases with age.  Gender. Men are at higher risk than women before age 45 years. After age 65, women are at higher risk than men. Risk factors you can control include:  Not getting enough exercise or physical activity.  Being overweight.  Getting too much fat, sugar, calories, or salt in your diet.  Drinking too much alcohol. SIGNS AND SYMPTOMS Hypertension does not usually cause signs or symptoms. Extremely high blood pressure (hypertensive crisis) may cause headache, anxiety, shortness of breath, and nosebleed. DIAGNOSIS To check if you have hypertension, your health care provider will measure your blood pressure while you are seated, with your arm held at the level of your heart. It should be measured at least twice using the same arm. Certain conditions can cause a difference in blood pressure between your right and left arms. A blood pressure reading that is higher than normal on one occasion does not mean that you need treatment. If  it is not clear whether you have high blood pressure, you may be asked to return on a different day to have your blood pressure checked again. Or, you may be asked to monitor your blood pressure at home for 1 or more weeks. TREATMENT Treating high blood pressure includes making lifestyle changes and possibly taking medicine. Living a healthy lifestyle can help lower high blood pressure. You may need to change some of your habits. Lifestyle changes may include:  Following the DASH diet. This diet is high in fruits, vegetables, and whole grains. It is low in salt, red meat, and added sugars.  Keep your sodium intake below 2,300 mg per day.  Getting at least 30-45 minutes of aerobic exercise at least 4 times per week.  Losing weight if necessary.  Not smoking.  Limiting alcoholic beverages.  Learning ways to reduce stress. Your health care provider may prescribe medicine if lifestyle changes are not enough to get your blood pressure under control, and if one of the following is true:  You are 18-59 years of age and your systolic blood pressure is above 140.  You are 60 years of age or older, and your systolic blood pressure is above 150.  Your diastolic blood pressure is above 90.  You have diabetes, and your systolic blood pressure is over 140 or your diastolic blood pressure is over 90.  You have kidney disease and your blood pressure is above 140/90.  You have heart disease and your blood pressure is above 140/90. Your personal target blood pressure may vary depending on your medical conditions, your age, and other factors. HOME CARE INSTRUCTIONS    Have your blood pressure rechecked as directed by your health care provider.   Take medicines only as directed by your health care provider. Follow the directions carefully. Blood pressure medicines must be taken as prescribed. The medicine does not work as well when you skip doses. Skipping doses also puts you at risk for  problems.  Do not smoke.   Monitor your blood pressure at home as directed by your health care provider. SEEK MEDICAL CARE IF:   You think you are having a reaction to medicines taken.  You have recurrent headaches or feel dizzy.  You have swelling in your ankles.  You have trouble with your vision. SEEK IMMEDIATE MEDICAL CARE IF:  You develop a severe headache or confusion.  You have unusual weakness, numbness, or feel faint.  You have severe chest or abdominal pain.  You vomit repeatedly.  You have trouble breathing. MAKE SURE YOU:   Understand these instructions.  Will watch your condition.  Will get help right away if you are not doing well or get worse.   This information is not intended to replace advice given to you by your health care provider. Make sure you discuss any questions you have with your health care provider.   Document Released: 11/20/2005 Document Revised: 04/06/2015 Document Reviewed: 09/12/2013 Elsevier Interactive Patient Education 2016 Elsevier Inc.  

## 2017-01-11 ENCOUNTER — Encounter: Payer: Self-pay | Admitting: Obstetrics and Gynecology

## 2017-01-11 ENCOUNTER — Ambulatory Visit (INDEPENDENT_AMBULATORY_CARE_PROVIDER_SITE_OTHER): Payer: 59 | Admitting: Obstetrics and Gynecology

## 2017-01-11 VITALS — BP 118/80 | HR 100 | Resp 16 | Ht 61.0 in | Wt 182.0 lb

## 2017-01-11 DIAGNOSIS — N898 Other specified noninflammatory disorders of vagina: Secondary | ICD-10-CM

## 2017-01-11 DIAGNOSIS — Z833 Family history of diabetes mellitus: Secondary | ICD-10-CM

## 2017-01-11 DIAGNOSIS — Z8742 Personal history of other diseases of the female genital tract: Secondary | ICD-10-CM | POA: Diagnosis not present

## 2017-01-11 DIAGNOSIS — N946 Dysmenorrhea, unspecified: Secondary | ICD-10-CM

## 2017-01-11 DIAGNOSIS — Z0000 Encounter for general adult medical examination without abnormal findings: Secondary | ICD-10-CM

## 2017-01-11 DIAGNOSIS — N920 Excessive and frequent menstruation with regular cycle: Secondary | ICD-10-CM | POA: Diagnosis not present

## 2017-01-11 DIAGNOSIS — Z124 Encounter for screening for malignant neoplasm of cervix: Secondary | ICD-10-CM | POA: Diagnosis not present

## 2017-01-11 DIAGNOSIS — N941 Unspecified dyspareunia: Secondary | ICD-10-CM | POA: Diagnosis not present

## 2017-01-11 DIAGNOSIS — Z01419 Encounter for gynecological examination (general) (routine) without abnormal findings: Secondary | ICD-10-CM | POA: Diagnosis not present

## 2017-01-11 DIAGNOSIS — N971 Female infertility of tubal origin: Secondary | ICD-10-CM

## 2017-01-11 LAB — CBC
HCT: 39.3 % (ref 35.0–45.0)
Hemoglobin: 12.9 g/dL (ref 11.7–15.5)
MCH: 29.5 pg (ref 27.0–33.0)
MCHC: 32.8 g/dL (ref 32.0–36.0)
MCV: 89.7 fL (ref 80.0–100.0)
MPV: 10.7 fL (ref 7.5–12.5)
PLATELETS: 257 10*3/uL (ref 140–400)
RBC: 4.38 MIL/uL (ref 3.80–5.10)
RDW: 13.6 % (ref 11.0–15.0)
WBC: 8.9 10*3/uL (ref 3.8–10.8)

## 2017-01-11 MED ORDER — ACYCLOVIR 400 MG PO TABS: ORAL_TABLET | ORAL | 3 refills | Status: AC

## 2017-01-11 NOTE — Progress Notes (Signed)
36 y.o. [id number] MarriedAfrican AmericanF here for annual exam.   In 2014 she had an ectopic pregnancy, had a laparoscopic salpingostomy. She had a HSG, she had bilateral blockage after that. She was diagnosed with endometriosis at the time of her laparoscopic.  She has a 75 year old son.  Married x 3 years, would like to have a baby, but not wanting to do IVF. Aware that she is at increased risk of ectopics.  Period Cycle (Days): 28 Period Duration (Days): 3-5 days  Period Pattern: Regular Menstrual Flow: Heavy Menstrual Control: Tampon, Maxi pad Menstrual Control Change Freq (Hours): changes pad every hour on heavy days  Dysmenorrhea: (!) Severe Dysmenorrhea Symptoms: Cramping  She has missed work because of her period. She takes Asprin powder for her cramps. No help with ibuprofen.  Sexually active, she has some deep dyspareunia, 7/10 times. Can help to change positions.  Her dysmenorrhea and flow has gotten heavier in the last year.  She has a family history of diabetes, multiple members on her mother's side.   Patient's last menstrual period was 12/21/2016.          Sexually active: Yes.    The current method of family planning is none.    Exercising: No.  The patient does not participate in regular exercise at present. Smoker:  yes  Health Maintenance: Pap:  2015 WNL per patient  History of abnormal Pap:  Yes Cryosurgery, years ago.  MMG:  Never Colonoscopy:  Never BMD:   Never TDaP:  2017 Gardasil: unsure   reports that she has been smoking Cigarettes.  She has never used smokeless tobacco. She reports that she drinks about 8.4 oz of alcohol per week . She reports that she does not use drugs.She is a CMA   Past Medical History:  Diagnosis Date  . Anxiety   . Ectopic pregnancy   . Endometriosis   . Hypertension   . Miscarriage   . STD (sexually transmitted disease)    HSV II   She has had 7 HSV outbreaks in the last year.   Past Surgical History:  Procedure  Laterality Date  . ECTOPIC PREGNANCY SURGERY  2015  . GYNECOLOGIC CRYOSURGERY      Current Outpatient Prescriptions  Medication Sig Dispense Refill  . acyclovir (ZOVIRAX) 200 MG capsule Take 1 capsule by mouth 2 (two) times daily.    . busPIRone (BUSPAR) 5 MG tablet Take 5 mg by mouth 2 (two) times daily.  3  . losartan (COZAAR) 50 MG tablet Take 50 mg by mouth daily.  4  . Multiple Vitamins-Minerals (MULTIVITAMIN WITH MINERALS) tablet Take 1 tablet by mouth daily.    Marland Kitchen zolpidem (AMBIEN) 10 MG tablet TAKE 1 TABLET BY MOUTH NIGHTLY AT BEDTIME AS NEEDED SLEEP  0   No current facility-administered medications for this visit.     Family History  Problem Relation Age of Onset  . Hypertension Mother   . Diabetes Mother   . Hypertension Father   . Diabetes Maternal Grandmother   . Hyperlipidemia Maternal Grandmother   . Heart failure Maternal Grandfather     Review of Systems  Constitutional: Negative.   HENT: Negative.   Eyes: Negative.   Respiratory: Negative.   Cardiovascular: Negative.   Gastrointestinal: Negative.   Endocrine: Negative.   Genitourinary: Positive for menstrual problem.       Dysmenorrhea Heavy menstrual bleeding   Musculoskeletal: Negative.   Skin: Negative.   Allergic/Immunologic: Negative.   Neurological: Negative.  Psychiatric/Behavioral: Negative.     Exam:   BP 118/80 (BP Location: Right Arm, Patient Position: Sitting, Cuff Size: Normal)   Pulse 100   Resp 16   Ht 5\' 1"  (1.549 m)   Wt 182 lb (82.6 kg)   LMP 12/21/2016   BMI 34.39 kg/m   Weight change: @WEIGHTCHANGE @ Height:   Height: 5\' 1"  (154.9 cm)  Ht Readings from Last 3 Encounters:  01/11/17 5\' 1"  (1.549 m)  02/17/16 4\' 11"  (1.499 m)  02/25/15 4\' 11"  (1.499 m)    General appearance: alert, cooperative and appears stated age Head: Normocephalic, without obvious abnormality, atraumatic Neck: no adenopathy, supple, symmetrical, trachea midline and thyroid normal to inspection and  palpation Lungs: clear to auscultation bilaterally Cardiovascular: regular rate and rhythm Breasts: normal appearance, no masses or tenderness Heart: regular rate and rhythm Abdomen: soft, non-tender; bowel sounds normal; no masses,  no organomegaly Extremities: extremities normal, atraumatic, no cyanosis or edema Skin: Skin color, texture, turgor normal. No rashes or lesions Lymph nodes: Cervical, supraclavicular, and axillary nodes normal. No abnormal inguinal nodes palpated Neurologic: Grossly normal   Pelvic: External genitalia:  no lesions              Urethra:  normal appearing urethra with no masses, tenderness or lesions              Bartholins and Skenes: normal                 Vagina: normal appearing vagina with normal color and an increase in white thick vaginal discharge (on questioning she does c/o an intermittent increase in vaginal d/c), no lesions              Cervix: no lesions               Bimanual Exam:  Uterus:  normal size, contour, position, consistency, mobility, non-tender              Adnexa: no masses, mildly tender bilateral adnexa               Rectovaginal: Confirms               Anus:  normal sphincter tone, no lesions Pelvic floor: not tender  Chaperone was present for exam.  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

## 2017-01-11 NOTE — Patient Instructions (Signed)
EXERCISE AND DIET:  We recommended that you start or continue a regular exercise program for good health. Regular exercise means any activity that makes your heart beat faster and makes you sweat.  We recommend exercising at least 30 minutes per day at least 3 days a week, preferably 4 or 5.  We also recommend a diet low in fat and sugar.  Inactivity, poor dietary choices and obesity can cause diabetes, heart attack, stroke, and kidney damage, among others.    ALCOHOL AND SMOKING:  Women should limit their alcohol intake to no more than 7 drinks/beers/glasses of wine (combined, not each!) per week. Moderation of alcohol intake to this level decreases your risk of breast cancer and liver damage. And of course, no recreational drugs are part of a healthy lifestyle.  And absolutely no smoking or even second hand smoke. Most people know smoking can cause heart and lung diseases, but did you know it also contributes to weakening of your bones? Aging of your skin?  Yellowing of your teeth and nails?  CALCIUM AND VITAMIN D:  Adequate intake of calcium and Vitamin D are recommended.  The recommendations for exact amounts of these supplements seem to change often, but generally speaking 600 mg of calcium (either carbonate or citrate) and 800 units of Vitamin D per day seems prudent. Certain women may benefit from higher intake of Vitamin D.  If you are among these women, your doctor will have told you during your visit.    PAP SMEARS:  Pap smears, to check for cervical cancer or precancers,  have traditionally been done yearly, although recent scientific advances have shown that most women can have pap smears less often.  However, every woman still should have a physical exam from her gynecologist every year. It will include a breast check, inspection of the vulva and vagina to check for abnormal growths or skin changes, a visual exam of the cervix, and then an exam to evaluate the size and shape of the uterus and  ovaries.  And after 35 years of age, a rectal exam is indicated to check for rectal cancers. We will also provide age appropriate advice regarding health maintenance, like when you should have certain vaccines, screening for sexually transmitted diseases, bone density testing, colonoscopy, mammograms, etc.   MAMMOGRAMS:  All women over 35 years old should have a yearly mammogram. Many facilities now offer a "3D" mammogram, which may cost around $50 extra out of pocket. If possible,  we recommend you accept the option to have the 3D mammogram performed.  It both reduces the number of women who will be called back for extra views which then turn out to be normal, and it is better than the routine mammogram at detecting truly abnormal areas.    COLONOSCOPY:  Colonoscopy to screen for colon cancer is recommended for all women at age 35.  We know, you hate the idea of the prep.  We agree, BUT, having colon cancer and not knowing it is worse!!  Colon cancer so often starts as a polyp that can be seen and removed at colonscopy, which can quite literally save your life!  And if your first colonoscopy is normal and you have no family history of colon cancer, most women don't have to have it again for 10 years.  Once every ten years, you can do something that may end up saving your life, right?  We will be happy to help you get it scheduled when you are ready.    Be sure to check your insurance coverage so you understand how much it will cost.  It may be covered as a preventative service at no cost, but you should check your particular policy.      Endometriosis Endometriosis is a condition in which the tissue that lines the uterus (endometrium) grows outside of its normal location. The tissue may grow in many locations close to the uterus, but it commonly grows on the ovaries, fallopian tubes, vagina, or bowel. When the uterus sheds the endometrium every menstrual cycle, there is bleeding wherever the endometrial  tissue is located. This can cause pain because blood is irritating to tissues that are not normally exposed to it. What are the causes? The cause of endometriosis is not known. What increases the risk? You may be more likely to develop endometriosis if you:  Have a family history of endometriosis.  Have never given birth.  Started your period at age 10 or younger.  Have high levels of estrogen in your body.  Were exposed to a certain medicine (diethylstilbestrol) before you were born (in utero).  Had low birth weight.  Were born as a twin, triplet, or other multiple.  Have a BMI of less than 25. BMI is an estimate of body fat and is calculated from height and weight. What are the signs or symptoms? Often, there are no symptoms of this condition. If you do have symptoms, they may:  Vary depending on where your endometrial tissue is growing.  Occur during your menstrual period (most common) or midcycle.  Come and go, or you may go months with no symptoms at all.  Stop with menopause. Symptoms may include:  Pain in the back or abdomen.  Heavier bleeding during periods.  Pain during sex.  Painful bowel movements.  Infertility.  Pelvic pain.  Bleeding more than once a month. How is this diagnosed? This condition is diagnosed based on your symptoms and a physical exam. You may have tests, such as:  Blood tests and urine tests. These may be done to help rule out other possible causes of your symptoms.  Ultrasound, to look for abnormal tissues.  An X-ray of the lower bowel (barium enema).  An ultrasound that is done through the vagina (transvaginally).  CT scan.  MRI.  Laparoscopy. In this procedure, a lighted, pencil-sized instrument called a laparoscope is inserted into your abdomen through an incision. The laparoscope allows your health care provider to look at the organs inside your body and check for abnormal tissue to confirm the diagnosis. If abnormal  tissue is found, your health care provider may remove a small piece of tissue (biopsy) to be examined under a microscope. How is this treated? Treatment for this condition may include:  Medicines to relieve pain, such as NSAIDs.  Hormone therapy. This involves using artificial (synthetic) hormones to reduce endometrial tissue growth. Your health care provider may recommend using a hormonal form of birth control, or other medicines.  Surgery. This may be done to remove abnormal endometrial tissue.  In some cases, tissue may be removed using a laparoscope and a laser (laparoscopic laser treatment).  In severe cases, surgery may be done to remove the fallopian tubes, uterus, and ovaries (hysterectomy). Follow these instructions at home:  Take over-the-counter and prescription medicines only as told by your health care provider.  Do not drive or use heavy machinery while taking prescription pain medicine.  Try to avoid activities that cause pain, including sexual activity.  Keep all follow-up visits   as told by your health care provider. This is important. Contact a health care provider if:  You have pain in the area between your hip bones (pelvic area) that occurs:  Before, during, or after your period.  In between your period and gets worse during your period.  During or after sex.  With bowel movements or urination, especially during your period.  You have problems getting pregnant.  You have a fever. Get help right away if:  You have severe pain that does not get better with medicine.  You have severe nausea and vomiting, or you cannot eat without vomiting.  You have pain that affects only the lower, right side of your abdomen.  You have abdominal pain that gets worse.  You have abdominal swelling.  You have blood in your stool. This information is not intended to replace advice given to you by your health care provider. Make sure you discuss any questions you have  with your health care provider. Document Released: 11/17/2000 Document Revised: 08/25/2016 Document Reviewed: 04/22/2016 Elsevier Interactive Patient Education  2017 [employer]vier [employer].  

## 2017-01-12 LAB — COMPREHENSIVE METABOLIC PANEL
ALT: 10 U/L (ref 6–29)
AST: 11 U/L (ref 10–30)
Albumin: 4.4 g/dL (ref 3.6–5.1)
Alkaline Phosphatase: 56 U/L (ref 33–115)
BUN: 22 mg/dL (ref 7–25)
CHLORIDE: 108 mmol/L (ref 98–110)
CO2: 26 mmol/L (ref 20–31)
CREATININE: 1.14 mg/dL — AB (ref 0.50–1.10)
Calcium: 9.3 mg/dL (ref 8.6–10.2)
Glucose, Bld: 83 mg/dL (ref 65–99)
POTASSIUM: 4.8 mmol/L (ref 3.5–5.3)
SODIUM: 142 mmol/L (ref 135–146)
Total Bilirubin: 0.2 mg/dL (ref 0.2–1.2)
Total Protein: 7.2 g/dL (ref 6.1–8.1)

## 2017-01-12 LAB — LIPID PANEL
CHOL/HDL RATIO: 3.5 ratio (ref <5.0)
Cholesterol: 155 mg/dL (ref <200)
HDL: 44 mg/dL — AB (ref >50)
LDL Cholesterol: 92 mg/dL (ref <100)
TRIGLYCERIDES: 97 mg/dL (ref <150)
VLDL: 19 mg/dL (ref <30)

## 2017-01-12 LAB — VITAMIN D 25 HYDROXY (VIT D DEFICIENCY, FRACTURES): Vit D, 25-Hydroxy: 11 ng/mL — ABNORMAL LOW (ref 30–100)

## 2017-01-12 LAB — HEMOGLOBIN A1C
Hgb A1c MFr Bld: 5.4 % (ref <5.7)
MEAN PLASMA GLUCOSE: 108 mg/dL

## 2017-01-12 LAB — FERRITIN: FERRITIN: 29 ng/mL (ref 10–154)

## 2017-01-12 LAB — TSH: TSH: 0.85 mIU/L

## 2017-01-15 ENCOUNTER — Telehealth: Payer: Self-pay | Admitting: Obstetrics and Gynecology

## 2017-01-15 NOTE — Telephone Encounter (Signed)
Patient is ready to schedule her PUS appointment.  °

## 2017-01-16 LAB — IPS PAP TEST WITH HPV

## 2017-01-17 ENCOUNTER — Telehealth: Payer: Self-pay | Admitting: *Deleted

## 2017-01-17 LAB — SURESWAB BACTERIAL VAGINOSIS/ITIS
Atopobium vaginae: 6.8 Log (cells/mL)
C. ALBICANS, DNA: DETECTED — AB
C. GLABRATA, DNA: NOT DETECTED
C. TROPICALIS, DNA: NOT DETECTED
C. parapsilosis, DNA: NOT DETECTED
LACTOBACILLUS SPECIES: NOT DETECTED Log (cells/mL)
MEGASPHAERA SPECIES: NOT DETECTED Log (cells/mL)
T. VAGINALIS RNA, QL TMA: NOT DETECTED

## 2017-01-17 MED ORDER — METRONIDAZOLE 500 MG PO TABS: 500.0000 mg | ORAL_TABLET | Freq: Two times a day (BID) | ORAL | 0 refills | Status: DC

## 2017-01-17 MED ORDER — VITAMIN D (ERGOCALCIFEROL) 1.25 MG (50000 UNIT) PO CAPS: 50000.0000 [IU] | ORAL_CAPSULE | ORAL | 0 refills | Status: DC

## 2017-01-17 MED ORDER — FLUCONAZOLE 150 MG PO TABS: 150.0000 mg | ORAL_TABLET | Freq: Once | ORAL | 0 refills | Status: AC

## 2017-01-17 NOTE — Telephone Encounter (Signed)
-----   Message from Salvadore Dom, MD sent at 01/17/2017  2:13 PM EST ----- Please inform the patient that her vaginitis probe was + for BV and treat with flagyl (either oral or vaginal, her choice), no ETOH while on Flagyl.  Oral: Flagyl 500 mg BID x 7 days, or Vaginal: Metrogel, 1 applicator per vagina q day x 5 days. Also + for yeast, treat with diflucan 150 mg po x 1, repeat in 72 hours if symptoms haven't resolved. #2, no refills Her good cholesterol is slightly low which increases her risk of heart disease in the future. The rest of her lipid panel is normal. She should eat a diet low in saturated fats, exercise regularly and have her lipids rechecked next year. Her renal function is minimally elevated, this could be do to something as minimal as dehydration. She should have her serum creatinine rechecked in the next few weeks (when well hydrated).  Her vit d is very low. Please send in a script for 50,000 IU po q week x 12 weeks. #12, no refills. Then she should have a f/u vit D level.  The rest of her lab work was normal.  02 pap recall.

## 2017-01-17 NOTE — Telephone Encounter (Signed)
Spoke with patient and went over all lab results in detail and sent in RX to [facility]. Per patient request-eh

## 2017-01-23 NOTE — Telephone Encounter (Signed)
Patient called back but could not hold for [facility].  She was at work and will try again later when she is in a less busy environment to schedule.  I did relay the patient's benefit information to her per [facility].

## 2017-01-24 NOTE — Telephone Encounter (Signed)
Patient returning your call.

## 2017-01-25 NOTE — Telephone Encounter (Signed)
Spoke with patient on 01/24/17 (see account notes.) patient is scheduled for ultrasound on 02/01/17 with Dr Talbert Nan. Ok to close encounter

## 2017-02-01 ENCOUNTER — Other Ambulatory Visit: Payer: 59 | Admitting: Obstetrics and Gynecology

## 2017-02-01 ENCOUNTER — Other Ambulatory Visit: Payer: 59

## 2017-03-05 ENCOUNTER — Telehealth: Payer: Self-pay | Admitting: Obstetrics and Gynecology

## 2017-03-05 NOTE — Telephone Encounter (Signed)
Patient is ready to reschedule her PUS/OV appointment.

## 2017-03-05 NOTE — Telephone Encounter (Signed)
Spoke with patient. Patient would like to reschedule his PUS and possible EMB. Appointment scheduled for 03/20/2017 with 1 pm consult with Dr.Jertson. Patient is agreeable to date and time.  Routing to provider for final review. Patient agreeable to disposition. Will close encounter.

## 2017-03-05 NOTE — Telephone Encounter (Signed)
Forwarding to Dr. Talbert Nan for review.  Sent to me but is Dr. Gentry Fitz pt.

## 2017-03-19 ENCOUNTER — Telehealth: Payer: Self-pay | Admitting: Obstetrics and Gynecology

## 2017-03-19 ENCOUNTER — Encounter: Payer: Self-pay | Admitting: Obstetrics and Gynecology

## 2017-03-19 NOTE — Telephone Encounter (Signed)
Patient called and left a message after hours cancelling her ultrasound appointment for tomorrow with less than our required 48 hour notice for this type of appointment. She said, "I have a long day tomorrow at work so I need to cancel the appointment. They got my vacation time request wrong, I am off next Tuesday so maybe I can come then."   I called the patient to confirm she wanted to cancel the appointment and reminded her of our appointment policy. Patient confirmed she needed to cancel due to her work schedule.  Routing to [place] to reschedule and to Dr. Talbert Nan for [place].

## 2017-03-20 ENCOUNTER — Other Ambulatory Visit: Payer: 59 | Admitting: Obstetrics and Gynecology

## 2017-03-20 ENCOUNTER — Other Ambulatory Visit: Payer: 59

## 2017-03-28 ENCOUNTER — Telehealth: Payer: Self-pay | Admitting: Obstetrics and Gynecology

## 2017-03-28 NOTE — Telephone Encounter (Signed)
Call to patient to discuss rescheduling the ultrasound appointment she canceled for 03/20/17.  Left a voicemail requesting a return call.   Routing to Dr Talbert Nan

## 2017-03-29 ENCOUNTER — Other Ambulatory Visit: Payer: Self-pay | Admitting: *Deleted

## 2017-03-29 DIAGNOSIS — N941 Unspecified dyspareunia: Secondary | ICD-10-CM

## 2017-03-29 DIAGNOSIS — N920 Excessive and frequent menstruation with regular cycle: Secondary | ICD-10-CM

## 2017-03-29 DIAGNOSIS — N946 Dysmenorrhea, unspecified: Secondary | ICD-10-CM

## 2017-03-29 DIAGNOSIS — Z8742 Personal history of other diseases of the female genital tract: Secondary | ICD-10-CM

## 2017-03-29 NOTE — Telephone Encounter (Signed)
Patient is returning a call to [place]. Patient states it is ok to leave a detailed message because she is at work.

## 2017-03-29 NOTE — Telephone Encounter (Signed)
Returned call to patient to reschedule ultrasound. Left a voicemail requesting a return call.

## 2017-03-29 NOTE — Telephone Encounter (Signed)
Patient returned call to reschedule ultrasound. Reviewed benefits. Patient understood and agreeable. Patient has rescheduled 04/17/17 with Dr Talbert Nan. Earlier appointments were offered, but patient declined, stating her husband is having surgery next week and she will not be available for scheduling until 04/17/17. Patient aware of date, arrival time and cancellation policy. No further questions.   Routing to Dr Talbert Nan for final review

## 2017-03-30 ENCOUNTER — Telehealth: Payer: Self-pay | Admitting: Obstetrics and Gynecology

## 2017-03-30 NOTE — Telephone Encounter (Signed)
Previous phone note closed in error. Opening a new message to ensure provider receives message.  Patient returned call on 03/29/17, to reschedule ultrasound. Reviewed benefits. Patient understood and agreeable. Patient has rescheduled 04/17/17 with Dr Talbert Nan. Earlier appointments were offered, but patient declined, stating her husband is having surgery next week and she will not be available for scheduling until 04/17/17. Patient aware of date, arrival time and cancellation policy. No further questions.   Routing to Dr Talbert Nan for final review

## 2017-04-03 ENCOUNTER — Emergency Department (HOSPITAL_COMMUNITY)
Admission: EM | Admit: 2017-04-03 | Discharge: 2017-04-03 | Disposition: A | Discharge status: Home / Self Care | Payer: 59 | Attending: Emergency Medicine | Admitting: Emergency Medicine

## 2017-04-03 ENCOUNTER — Emergency Department (HOSPITAL_COMMUNITY): Payer: 59

## 2017-04-03 ENCOUNTER — Encounter (HOSPITAL_COMMUNITY): Payer: Self-pay | Admitting: *Deleted

## 2017-04-03 DIAGNOSIS — D259 Leiomyoma of uterus, unspecified: Secondary | ICD-10-CM | POA: Insufficient documentation

## 2017-04-03 DIAGNOSIS — N888 Other specified noninflammatory disorders of cervix uteri: Secondary | ICD-10-CM | POA: Diagnosis not present

## 2017-04-03 DIAGNOSIS — Z7982 Long term (current) use of aspirin: Secondary | ICD-10-CM | POA: Diagnosis not present

## 2017-04-03 DIAGNOSIS — I10 Essential (primary) hypertension: Secondary | ICD-10-CM | POA: Insufficient documentation

## 2017-04-03 DIAGNOSIS — Z79899 Other long term (current) drug therapy: Secondary | ICD-10-CM | POA: Diagnosis not present

## 2017-04-03 DIAGNOSIS — Z87891 Personal history of nicotine dependence: Secondary | ICD-10-CM | POA: Diagnosis not present

## 2017-04-03 DIAGNOSIS — N889 Noninflammatory disorder of cervix uteri, unspecified: Secondary | ICD-10-CM

## 2017-04-03 DIAGNOSIS — D219 Benign neoplasm of connective and other soft tissue, unspecified: Secondary | ICD-10-CM

## 2017-04-03 DIAGNOSIS — N898 Other specified noninflammatory disorders of vagina: Secondary | ICD-10-CM | POA: Diagnosis present

## 2017-04-03 LAB — WET PREP, GENITAL
CLUE CELLS WET PREP: NONE SEEN
SPERM: NONE SEEN
TRICH WET PREP: NONE SEEN
WBC WET PREP: NONE SEEN
Yeast Wet Prep HPF POC: NONE SEEN

## 2017-04-03 NOTE — ED Notes (Signed)
US at bedside

## 2017-04-03 NOTE — ED Notes (Signed)
Bed: WA07 Expected date:  Expected time:  Means of arrival:  Comments: 

## 2017-04-03 NOTE — Discharge Instructions (Signed)
Tests show evidence of a cyst on your cervix and a uterine fibroid. These can both be followed up by your OB/GYN.

## 2017-04-03 NOTE — ED Triage Notes (Signed)
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  Pt has noticed some brown blood color discharge.  Pt reports pain her lower pelvic region.  Pt dx with endometriosis in 2015.  Pt has an appt with her OBGYN on May 15.  Pt reports that she usually has heavy bleeding for her menstrual cycle but has only been spotting since march. Pt had a negative pregnancy test last month. Pt reports the last time she had intercourse was in February.

## 2017-04-04 ENCOUNTER — Telehealth: Payer: Self-pay | Admitting: Obstetrics and Gynecology

## 2017-04-04 LAB — GC/CHLAMYDIA PROBE AMP (~~LOC~~) NOT AT ARMC
Chlamydia: NEGATIVE
Neisseria Gonorrhea: NEGATIVE

## 2017-04-04 NOTE — ED Provider Notes (Signed)
[facility] DEPT Provider Note   CSN: [id number] Arrival date & time: 04/03/17  1055     History   Chief Complaint No chief complaint on file.   HPI Claudia Ball is a 36 y.o. female.  Patient is concerned about a "knot"  that she palpated on her cervix while doing a manual exam. She was diagnosed with endometriosis in 2014. LMP end of March. She has noted some spotting. Surgical history G2 P1 Ab1. Last intercourse in February. No fever, sweats, chills, dysuria. Severity of symptoms is mild.      Past Medical History:  Diagnosis Date  . Anxiety   . Ectopic pregnancy   . Endometriosis   . Hypertension   . Miscarriage   . STD (sexually transmitted disease)    HSV II     There are no active problems to display for this patient.   Past Surgical History:  Procedure Laterality Date  . ECTOPIC PREGNANCY SURGERY  2015  . GYNECOLOGIC CRYOSURGERY      OB History    Gravida Para Term Preterm AB Living   4 1 1   3 1    SAB TAB Ectopic Multiple Live Births   2   1   1        Home Medications    Prior to Admission medications   Medication Sig Start Date End Date Taking? Authorizing Provider  acyclovir (ZOVIRAX) 400 MG tablet 1 tab po BID 01/11/17  Yes Salvadore Dom, MD  ADDERALL XR 20 MG 24 hr capsule Take 20 mg by mouth daily.  03/27/17  Yes Historical Provider, MD  Aspirin-Acetaminophen-Caffeine (GOODY HEADACHE PO) Take 1 packet by mouth every 8 (eight) hours as needed. For pain   Yes Historical Provider, MD  busPIRone (BUSPAR) 5 MG tablet Take 5 mg by mouth 2 (two) times daily. 12/11/16  Yes Historical Provider, MD  losartan (COZAAR) 50 MG tablet Take 50 mg by mouth daily. 12/11/16  Yes Historical Provider, MD  Multiple Vitamins-Minerals (MULTIVITAMIN WITH MINERALS) tablet Take 1 tablet by mouth daily.   Yes Historical Provider, MD  Vitamin D, Ergocalciferol, (DRISDOL) 50000 units CAPS capsule Take 1 capsule (50,000 Units total) by mouth every 7 (seven) days.  01/17/17  Yes Salvadore Dom, MD  zolpidem (AMBIEN) 10 MG tablet TAKE 1 TABLET BY MOUTH NIGHTLY AT BEDTIME AS NEEDED SLEEP 12/11/16  Yes Historical Provider, MD  metroNIDAZOLE (FLAGYL) 500 MG tablet Take 1 tablet (500 mg total) by mouth 2 (two) times daily. Patient not taking: Reported on 04/03/2017 01/17/17   Salvadore Dom, MD    Family History Family History  Problem Relation Age of Onset  . Hypertension Mother   . Diabetes Mother   . Hypertension Father   . Diabetes Maternal Grandmother   . Hyperlipidemia Maternal Grandmother   . Heart failure Maternal Grandfather     Social History Social History  Substance Use Topics  . Smoking status: Former Smoker    Types: Cigarettes  . Smokeless tobacco: Never Used  . Alcohol use 4.2 oz/week    7 Glasses of wine per week     Allergies   Patient has no known allergies.   Review of Systems Review of Systems  All other systems reviewed and are negative.    Physical Exam Updated Vital Signs BP (!) 135/92   Pulse 89   Temp 98.3 F (36.8 C) (Oral)   Resp 14   LMP 03/02/2017   SpO2 100%   Physical Exam  Constitutional:  She is oriented to person, place, and time. She appears well-developed and well-nourished.  HENT:  Head: Normocephalic and atraumatic.  Eyes: Conjunctivae are normal.  Neck: Neck supple.  Cardiovascular: Normal rate and regular rhythm.   Pulmonary/Chest: Effort normal and breath sounds normal.  Abdominal: Soft. Bowel sounds are normal.  Genitourinary:  Genitourinary Comments: Pelvic exam: Normal external genitalia. There is a cystic appearing structure at about 3:00 on her cervix approximately 3-4 mm in diameter.  Musculoskeletal: Normal range of motion.  Neurological: She is alert and oriented to person, place, and time.  Skin: Skin is warm and dry.  Psychiatric: She has a normal mood and affect. Her behavior is normal.  Nursing note and vitals reviewed.    ED Treatments / Results  Labs (all  labs ordered are listed, but only abnormal results are displayed) Labs Reviewed  WET PREP, GENITAL  GC/CHLAMYDIA PROBE AMP ([place]) NOT AT [facility]    EKG  EKG Interpretation None       Radiology US Transvaginal Non-ob  Result Date: 04/03/2017 CLINICAL DATA:  Prominence and cervix.  History of endometriosis EXAM: TRANSABDOMINAL AND TRANSVAGINAL ULTRASOUND OF PELVIS TECHNIQUE: Study was performed transabdominally to optimize pelvic field of view evaluation and transvaginally to optimize internal visceral architecture evaluation. COMPARISON:  None FINDINGS: Uterus Measurements: 8.1 x 6.8 x 4.8 cm. There is a hypoechoic mass arising from the anterior uterine fundus measuring 3.2 x 3.4 x 4.0 cm consistent with a leiomyoma. No other intrauterine mass is evident. There is a nabothian cyst arising from the cervix measuring 0.8 x 0.5 x 0.5 cm. Endometrium Thickness: 7 mm.  No focal abnormality visualized. Right ovary Measurements: 3.1 x 1.6 x 3.3 cm. Normal appearance/no adnexal mass. Left ovary Measurements: 2.7 x 2.2 x 2.2 cm. Normal appearance/no adnexal mass. Other findings No abnormal free fluid. IMPRESSION: Leiomyoma measuring 3.2 x 3.4 x 4.0 cm arising from the anterior leftward aspect of the uterine fundus. Subcentimeter nabothian cyst arising from the cervix. Study otherwise unremarkable. Electronically Signed   By: Lowella Grip III M.D.   On: 04/03/2017 13:58   US Pelvis Complete  Result Date: 04/03/2017 CLINICAL DATA:  Prominence and cervix.  History of endometriosis EXAM: TRANSABDOMINAL AND TRANSVAGINAL ULTRASOUND OF PELVIS TECHNIQUE: Study was performed transabdominally to optimize pelvic field of view evaluation and transvaginally to optimize internal visceral architecture evaluation. COMPARISON:  None FINDINGS: Uterus Measurements: 8.1 x 6.8 x 4.8 cm. There is a hypoechoic mass arising from the anterior uterine fundus measuring 3.2 x 3.4 x 4.0 cm consistent with a leiomyoma. No other  intrauterine mass is evident. There is a nabothian cyst arising from the cervix measuring 0.8 x 0.5 x 0.5 cm. Endometrium Thickness: 7 mm.  No focal abnormality visualized. Right ovary Measurements: 3.1 x 1.6 x 3.3 cm. Normal appearance/no adnexal mass. Left ovary Measurements: 2.7 x 2.2 x 2.2 cm. Normal appearance/no adnexal mass. Other findings No abnormal free fluid. IMPRESSION: Leiomyoma measuring 3.2 x 3.4 x 4.0 cm arising from the anterior leftward aspect of the uterine fundus. Subcentimeter nabothian cyst arising from the cervix. Study otherwise unremarkable. Electronically Signed   By: Lowella Grip III M.D.   On: 04/03/2017 13:58    Procedures Procedures (including critical care time)  Medications Ordered in ED Medications - No data to display   Initial Impression / Assessment and Plan / ED Course  I have reviewed the triage vital signs and the nursing notes.  Pertinent labs & imaging results that were available during  my care of the patient were reviewed by me and considered in my medical decision making (see chart for details).     Patient is in no acute distress. Ultrasound reveals a leftward leiomyoma and a cervical nabothian cyst.  She has had gyn appt on 04/17/17.  Final Clinical Impressions(s) / ED Diagnoses   Final diagnoses:  Cervical cyst  Leiomyoma    New Prescriptions Discharge Medication List as of 04/03/2017  3:19 PM       Nat Christen, MD 04/04/17 [phone]

## 2017-04-04 NOTE — Telephone Encounter (Signed)
Patient called to let Dr. Talbert Nan know she had an ultrasound yesterday at the [facility] ED, 04/03/17. She wants to know if she needs to keep her scheduled ultrasound on 04/17/17 in our office.

## 2017-04-05 ENCOUNTER — Telehealth: Payer: Self-pay | Admitting: Obstetrics and Gynecology

## 2017-04-05 DIAGNOSIS — Z30430 Encounter for insertion of intrauterine contraceptive device: Secondary | ICD-10-CM

## 2017-04-05 NOTE — Telephone Encounter (Signed)
Patient presented to [facility] ED on 04/03/2017 with  concerns about a "knot"  that she palpated on her cervix while doing a manual exam. Patient's LMP was in February, but reports intermittent spotting since.  She had an ectopic pregnancy in 2014, had a laparoscopic salpingostomy. She had a HSG, she had bilateral blockage after that. She was diagnosed with endometriosis at the time of her laparoscopic.  Patient had PUS at ED which showed Leiomyoma measuring 3.2 x 3.4 x 4.0 cm arising from the anterior leftward aspect of the uterine fundus. Subcentimeter nabothian cyst arising from the cervix. Patient has PUS scheduled in office on 04/17/2017 with Dr.Jertson for evaluation of dysmenorrhea and deep dyspareunia.  Dr.Jertson, please advise.

## 2017-04-05 NOTE — Telephone Encounter (Signed)
Spoke with patient. Advised of message as seen below from [place]. Patient verbalizes understanding. PUS for 04/17/2017 cancelled. Appointment for discussion of results and cycle management scheduled for 04/17/2017 at 10:30 am with Dr.Jertson. Patient is agreeable to date and time.  Routing to provider for final review. Patient agreeable to disposition. Will close encounter.

## 2017-04-05 NOTE — Telephone Encounter (Signed)
Dr. Talbert Nan, per review of EPIC, patient is scheduled for OV to discuss PUS results and cycle control on 04/17/17. Patient is requesting Mirena IUD placement, please advise?

## 2017-04-05 NOTE — Telephone Encounter (Signed)
Patient is calling to see if she can possibly have the Mirena IUD insertion on her next visit 04/16/17.

## 2017-04-05 NOTE — Telephone Encounter (Signed)
Please cancel the patient's upcoming ultrasound appointment. Let her know that I did review the ultrasound report and images that she did have.  Nothing seen that requires treatment (ie no endometriomas). She can come in to further discuss treatment of her cycles if desired.

## 2017-04-05 NOTE — Telephone Encounter (Signed)
Ideally we should insert the mirena IUD during her menstrual cycle. We can review the ultrasound and place the IUD the same day. Please make sure her cycle will correlate with her appointment.  She has a h/o tubal blockage, but isn't using contraception. If she hasn't been sexually active since her period and abstains until her appointment, then I could also place the IUD.

## 2017-04-06 NOTE — Telephone Encounter (Signed)
Spoke with patient, advised as seen below per Dr. Talbert Nan. Patient states she has been spotting since March, no full cycles. Patient reports last having intercourse on 2/14. Advised patient to abstain from intercourse until IUD placement, will do UPT on day of procedure. Patient scheduled for IUD placement with Dr. Talbert Nan on 5/15 at [facility] to take Motrin 800 mg with food and water one hour before procedure. Advised patient would update and review with Dr. Talbert Nan, if any additional recommendations will return call, patient is agreeable.   Dr. Talbert Nan, do you agree with recommendations?  Cc: Lerry Liner

## 2017-04-16 ENCOUNTER — Other Ambulatory Visit: Payer: Self-pay

## 2017-04-17 ENCOUNTER — Other Ambulatory Visit: Payer: 59 | Admitting: Obstetrics and Gynecology

## 2017-04-17 ENCOUNTER — Encounter: Payer: Self-pay | Admitting: Obstetrics and Gynecology

## 2017-04-17 ENCOUNTER — Ambulatory Visit: Payer: Self-pay

## 2017-04-17 ENCOUNTER — Ambulatory Visit (INDEPENDENT_AMBULATORY_CARE_PROVIDER_SITE_OTHER): Payer: 59 | Admitting: Obstetrics and Gynecology

## 2017-04-17 ENCOUNTER — Other Ambulatory Visit: Payer: 59

## 2017-04-17 ENCOUNTER — Ambulatory Visit: Payer: 59 | Admitting: Obstetrics and Gynecology

## 2017-04-17 VITALS — BP 138/80 | HR 96 | Resp 16 | Wt 181.0 lb

## 2017-04-17 DIAGNOSIS — Z308 Encounter for other contraceptive management: Secondary | ICD-10-CM

## 2017-04-17 DIAGNOSIS — N946 Dysmenorrhea, unspecified: Secondary | ICD-10-CM

## 2017-04-17 DIAGNOSIS — L292 Pruritus vulvae: Secondary | ICD-10-CM | POA: Diagnosis not present

## 2017-04-17 DIAGNOSIS — Z8742 Personal history of other diseases of the female genital tract: Secondary | ICD-10-CM | POA: Diagnosis not present

## 2017-04-17 DIAGNOSIS — Z01812 Encounter for preprocedural laboratory examination: Secondary | ICD-10-CM

## 2017-04-17 DIAGNOSIS — N941 Unspecified dyspareunia: Secondary | ICD-10-CM | POA: Diagnosis not present

## 2017-04-17 DIAGNOSIS — Z30430 Encounter for insertion of intrauterine contraceptive device: Secondary | ICD-10-CM

## 2017-04-17 DIAGNOSIS — N898 Other specified noninflammatory disorders of vagina: Secondary | ICD-10-CM

## 2017-04-17 LAB — POCT URINE PREGNANCY: PREG TEST UR: NEGATIVE

## 2017-04-17 MED ORDER — MEDROXYPROGESTERONE ACETATE 150 MG/ML IM SUSP
150.0000 mg | Freq: Once | INTRAMUSCULAR | Status: AC
  Administered 2017-04-17: 150 mg via INTRAMUSCULAR

## 2017-04-17 NOTE — Addendum Note (Signed)
Addended by: Dorothy Spark on: 04/17/2017 04:03 PM   Modules accepted: Orders

## 2017-04-17 NOTE — Progress Notes (Addendum)
GYNECOLOGY  VISIT   HPI: 36 y.o.   Married  Claudia American  Ball   [phone] with Patient's last menstrual period was 02/26/2017.   here for IUD Verdia Kuba) insertion. She was having regular cycles until February. Her next cycle was abnormal, just spotted for just over a month. Stopped bleeding the end of April, never heavy.    She had her blood work drawn on 04/11/17 TFT's were normal, her estrogen level was high. She will check a prolactin level at work.  She hasn't had sex since 2/18 secondary to dyspareunia. 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 She had an ectopic pregnancy in 2014, had a salpingostomy, chromopertubation, was told both of her tubes are blocked.  She just had a normal ultrasound on 04/03/17 (small fundal fibroid).  Her primary gave her tylenol with codeine, he gave her this for her cycles.    GYNECOLOGIC HISTORY: Patient's last menstrual period was 02/26/2017. Contraception:none  Menopausal hormone therapy: none        OB History    Gravida Para Term Preterm AB Living   4 1 1   3 1    SAB TAB Ectopic Multiple Live Births   2   1   1          There are no active problems to display for this patient.   Past Medical History:  Diagnosis Date  . Anxiety   . Ectopic pregnancy   . Endometriosis   . Hypertension   . Miscarriage   . STD (sexually transmitted disease)    HSV II     Past Surgical History:  Procedure Laterality Date  . ECTOPIC PREGNANCY SURGERY  2015  . GYNECOLOGIC CRYOSURGERY      Current Outpatient Prescriptions  Medication Sig Dispense Refill  . acyclovir (ZOVIRAX) 400 MG tablet 1 tab po BID 180 tablet 3  . ADDERALL XR 20 MG 24 hr capsule Take 20 mg by mouth daily.   0  . Aspirin-Acetaminophen-Caffeine (GOODY HEADACHE PO) Take 1 packet by mouth every 8 (eight) hours as needed. For pain    . busPIRone (BUSPAR) 5 MG tablet Take 5 mg by mouth 2 (two) times daily.  3  . losartan (COZAAR) 50 MG tablet Take 50  mg by mouth daily.  4  . Multiple Vitamins-Minerals (MULTIVITAMIN WITH MINERALS) tablet Take 1 tablet by mouth daily.    . Vitamin D, Ergocalciferol, (DRISDOL) 50000 units CAPS capsule Take 1 capsule (50,000 Units total) by mouth every 7 (seven) days. 12 capsule 0  . zolpidem (AMBIEN) 10 MG tablet TAKE 1 TABLET BY MOUTH NIGHTLY AT BEDTIME AS NEEDED SLEEP  0   No current facility-administered medications for this visit.      ALLERGIES: Patient has no known allergies.  Family History  Problem Relation Age of Onset  . Hypertension Mother   . Diabetes Mother   . Hypertension Father   . Diabetes Maternal Grandmother   . Hyperlipidemia Maternal Grandmother   . Heart failure Maternal Grandfather     Social History   Social History  . Marital status: Married    Spouse name: N/A  . Number of children: N/A  . Years of education: N/A   Occupational History  . Not on file.   Social History Main Topics  . Smoking status: Former Smoker    Types: Cigarettes  . Smokeless tobacco: Never Used  . Alcohol use 4.2 oz/week    7 Glasses of wine per week  .  Drug use: No  . Sexual activity: Yes    Partners: Male    Birth control/ protection: None   Other Topics Concern  . Not on file   Social History Narrative  . No narrative on file    Review of Systems  Constitutional: Negative.   HENT: Negative.   Eyes: Negative.   Respiratory: Negative.   Cardiovascular: Negative.   Gastrointestinal: Negative.   Genitourinary: Negative.   Musculoskeletal: Negative.   Skin: Negative.   Neurological: Negative.   Endo/Heme/Allergies: Negative.   Psychiatric/Behavioral: Negative.     PHYSICAL EXAMINATION:    BP 138/80 (BP Location: Right Arm, Patient Position: Sitting, Cuff Size: Normal)   Pulse 96   Resp 16   Wt 181 lb (82.1 kg)   LMP 02/26/2017   BMI 34.20 kg/m     General appearance: alert, cooperative and appears stated age Pelvic: External genitalia:  no lesions               Urethra:  normal appearing urethra with no masses, tenderness or lesions              Bartholins and Skenes: normal                 Vagina: normal appearing vagina with slight increase in a thick white vaginal d/c (on questioning, the patient does c/o mild intermittent itching)              Cervix: no lesions              The patient was consented for IUD insertion. After washing the patient's cervix for IUD insertion, she changed her mind. No IUD insertion  Chaperone was present for exam.  ASSESSMENT H/O endometriosis,  H/O infertility and tubal blockage, h/o ectopic H/O dysmenorrhea and dyspareunia H/O HTN Vaginal discharge Recent episode of abnormal uterine bleeding. Negative UPT. She had normal TFT's at work, she will draw a prolactin level at work    PLAN Long discussion as to options: laparoscopy (with me or the infertility specialist), mirena IUD and depo-provera. Risks and side effects reviewed.  Not a candidate for OCP's Vaginitis panel sent off Will start depo-provera, she is aware of the risks.  F/U in 3 months, sooner with concerns (she hasn't been sexually active for over 3 months, negative UPT today)    An After Visit Summary was printed and given to the patient.  Over 30  minutes face to face time of which over 50% was spent in counseling.   04/23/17: Addendum: patient had her prolactin level drawn at work. It returned at 15.7 ng/ml (normal)

## 2017-04-18 ENCOUNTER — Telehealth: Payer: Self-pay | Admitting: *Deleted

## 2017-04-18 LAB — WET PREP BY MOLECULAR PROBE
Candida species: DETECTED — AB
GARDNERELLA VAGINALIS: NOT DETECTED
Trichomonas vaginosis: NOT DETECTED

## 2017-04-18 MED ORDER — FLUCONAZOLE 150 MG PO TABS: 150.0000 mg | ORAL_TABLET | Freq: Once | ORAL | 0 refills | Status: AC

## 2017-04-18 NOTE — Telephone Encounter (Signed)
Left message to call regarding results -eh 

## 2017-04-18 NOTE — Telephone Encounter (Signed)
Spoke with patient and gave results and recommendations. Sent in RX for Diflucan to pharmacy. Patient voiced understanding -eh

## 2017-04-18 NOTE — Telephone Encounter (Signed)
-----   Message from Salvadore Dom, MD sent at 04/18/2017  9:18 AM EDT ----- Please inform + for yeast and treat with diflucan 150 mg x 1, may repeat in 72 hours if still symptomatic. #2, no refills

## 2017-04-23 ENCOUNTER — Telehealth: Payer: Self-pay | Admitting: *Deleted

## 2017-04-23 NOTE — Telephone Encounter (Signed)
Spoke with patient, advised per Dr. Talbert Nan, prolactin level drawn on 04/19/17, normal. Patient verbalizes understanding.    Copy of labs to scan.  Routing to provider for final review. Patient is agreeable to disposition. Will close encounter.

## 2017-07-18 ENCOUNTER — Ambulatory Visit (INDEPENDENT_AMBULATORY_CARE_PROVIDER_SITE_OTHER): Payer: 59 | Admitting: Obstetrics and Gynecology

## 2017-07-18 ENCOUNTER — Encounter: Payer: Self-pay | Admitting: Obstetrics and Gynecology

## 2017-07-18 VITALS — BP 118/68 | HR 96 | Resp 16 | Wt 173.0 lb

## 2017-07-18 DIAGNOSIS — N946 Dysmenorrhea, unspecified: Secondary | ICD-10-CM | POA: Diagnosis not present

## 2017-07-18 DIAGNOSIS — Z3042 Encounter for surveillance of injectable contraceptive: Secondary | ICD-10-CM | POA: Diagnosis not present

## 2017-07-18 DIAGNOSIS — N941 Unspecified dyspareunia: Secondary | ICD-10-CM

## 2017-07-18 DIAGNOSIS — Z8742 Personal history of other diseases of the female genital tract: Secondary | ICD-10-CM

## 2017-07-18 MED ORDER — MEDROXYPROGESTERONE ACETATE 150 MG/ML IM SUSP
150.0000 mg | Freq: Once | INTRAMUSCULAR | Status: AC
  Administered 2017-07-18: 150 mg via INTRAMUSCULAR

## 2017-07-18 NOTE — Progress Notes (Signed)
GYNECOLOGY  VISIT   HPI: 36 y.o.   Married  [place] American  female   [phone] with No LMP recorded. Patient has had an injection.   here for f/u (endometriosis & depo). The patient was started on depo-provera in 5/18 secondary to dyspareunia and dysmenorrhea. She has a h/o endometriosis and secondary infertility with bilateral tubal blockage. She spotted for a few weeks after her first depo shot, no cramping. She has been able to have sex, no longer hurting.  She did have abnormal bleeding last spring, normal evaluation, including negative UPT, normal TSH, normal prolactin. She had a normal ultrasound on 04/03/17 other than a small fundal fibroid.   GYNECOLOGIC HISTORY: No LMP recorded. Patient has had an injection. Contraception: Depo-Provera Menopausal hormone therapy: n/a        OB History    Gravida Para Term Preterm AB Living   4 1 1   3 1    SAB TAB Ectopic Multiple Live Births   2   1   1          There are no active problems to display for this patient.   Past Medical History:  Diagnosis Date  . Anxiety   . Ectopic pregnancy   . Endometriosis   . Hypertension   . Miscarriage   . STD (sexually transmitted disease)    HSV II     Past Surgical History:  Procedure Laterality Date  . ECTOPIC PREGNANCY SURGERY  2015  . GYNECOLOGIC CRYOSURGERY      Current Outpatient Prescriptions  Medication Sig Dispense Refill  . acyclovir (ZOVIRAX) 400 MG tablet 1 tab po BID 180 tablet 3  . ADDERALL XR 20 MG 24 hr capsule Take 20 mg by mouth daily.   0  . Aspirin-Acetaminophen-Caffeine (GOODY HEADACHE PO) Take 1 packet by mouth every 8 (eight) hours as needed. For pain    . busPIRone (BUSPAR) 5 MG tablet Take 5 mg by mouth 2 (two) times daily.  3  . hydrochlorothiazide (HYDRODIURIL) 25 MG tablet Take 25 mg by mouth daily.  4  . Multiple Vitamins-Minerals (MULTIVITAMIN WITH MINERALS) tablet Take 1 tablet by mouth daily.    Marland Kitchen zolpidem (AMBIEN) 10 MG tablet TAKE 1 TABLET BY MOUTH  NIGHTLY AT BEDTIME AS NEEDED SLEEP  0   No current facility-administered medications for this visit.      ALLERGIES: Patient has no known allergies.  Family History  Problem Relation Age of Onset  . Hypertension Mother   . Diabetes Mother   . Hypertension Father   . Diabetes Maternal Grandmother   . Hyperlipidemia Maternal Grandmother   . Heart failure Maternal Grandfather     Social History   Social History  . Marital status: Married    Spouse name: N/A  . Number of children: N/A  . Years of education: N/A   Occupational History  . Not on file.   Social History Main Topics  . Smoking status: Former Smoker    Types: Cigarettes  . Smokeless tobacco: Never Used  . Alcohol use 4.2 oz/week    7 Glasses of wine per week  . Drug use: No  . Sexual activity: Yes    Partners: Male    Birth control/ protection: Injection   Other Topics Concern  . Not on file   Social History Narrative  . No narrative on file    Review of Systems  Constitutional: Negative.   HENT: Negative.   Eyes: Negative.   Respiratory: Negative.  Cardiovascular: Negative.   Gastrointestinal: Negative.   Genitourinary: Negative.   Musculoskeletal: Negative.   Skin: Negative.   Neurological: Negative.   Endo/Heme/Allergies: Negative.   Psychiatric/Behavioral: Negative.     PHYSICAL EXAMINATION:    BP 118/68 (BP Location: Right Arm, Patient Position: Sitting, Cuff Size: Large)   Pulse 96   Resp 16   Wt 173 lb (78.5 kg)   BMI 32.69 kg/m     General appearance: alert, cooperative and appears stated age  ASSESSMENT H/O endometriosis, secondary infertility, dysmenorrhea and dyspareunia. Dysmenorrhea and dyspareunia have resolved since starting on the depo-provera.     PLAN Continue depo-provera every 12 weeks until her annual exam in 2/19 F/U for annual in 2/19 Call with any concerns   An After Visit Summary was printed and given to the patient.

## 2017-07-18 NOTE — Progress Notes (Signed)
Patient is here for Depo Provera Injection Patient is within Depo Provera Calender Limits yes Next Depo Due between: 10/31-8/14 Last AEX: 01/11/17 JJ AEX Scheduled: 01/14/18   Patient is aware when next depo is due  Pt tolerated Injection well in [place].  Routed to provider for review, encounter closed.

## 2017-07-18 NOTE — Addendum Note (Signed)
Addended by: Terence Lux A on: 07/18/2017 08:48 AM   Modules accepted: Orders

## 2017-07-18 NOTE — Progress Notes (Deleted)
GYNECOLOGY  VISIT   HPI: 36 y.o.   Married  [place] American  female   [phone] with No LMP recorded.   here for     GYNECOLOGIC HISTORY: No LMP recorded. Contraception: Verdia Kuba IUD - inserted 04/17/17 Menopausal hormone therapy: n/a        OB History    Gravida Para Term Preterm AB Living   4 1 1   3 1    SAB TAB Ectopic Multiple Live Births   2   1   1          There are no active problems to display for this patient.   Past Medical History:  Diagnosis Date  . Anxiety   . Ectopic pregnancy   . Endometriosis   . Hypertension   . Miscarriage   . STD (sexually transmitted disease)    HSV II     Past Surgical History:  Procedure Laterality Date  . ECTOPIC PREGNANCY SURGERY  2015  . GYNECOLOGIC CRYOSURGERY      Current Outpatient Prescriptions  Medication Sig Dispense Refill  . acyclovir (ZOVIRAX) 400 MG tablet 1 tab po BID 180 tablet 3  . ADDERALL XR 20 MG 24 hr capsule Take 20 mg by mouth daily.   0  . Aspirin-Acetaminophen-Caffeine (GOODY HEADACHE PO) Take 1 packet by mouth every 8 (eight) hours as needed. For pain    . busPIRone (BUSPAR) 5 MG tablet Take 5 mg by mouth 2 (two) times daily.  3  . losartan (COZAAR) 50 MG tablet Take 50 mg by mouth daily.  4  . Multiple Vitamins-Minerals (MULTIVITAMIN WITH MINERALS) tablet Take 1 tablet by mouth daily.    . Vitamin D, Ergocalciferol, (DRISDOL) 50000 units CAPS capsule Take 1 capsule (50,000 Units total) by mouth every 7 (seven) days. 12 capsule 0  . zolpidem (AMBIEN) 10 MG tablet TAKE 1 TABLET BY MOUTH NIGHTLY AT BEDTIME AS NEEDED SLEEP  0   No current facility-administered medications for this visit.      ALLERGIES: Patient has no known allergies.  Family History  Problem Relation Age of Onset  . Hypertension Mother   . Diabetes Mother   . Hypertension Father   . Diabetes Maternal Grandmother   . Hyperlipidemia Maternal Grandmother   . Heart failure Maternal Grandfather     Social History   Social History   . Marital status: Married    Spouse name: N/A  . Number of children: N/A  . Years of education: N/A   Occupational History  . Not on file.   Social History Main Topics  . Smoking status: Former Smoker    Types: Cigarettes  . Smokeless tobacco: Never Used  . Alcohol use 4.2 oz/week    7 Glasses of wine per week  . Drug use: No  . Sexual activity: Yes    Partners: Male    Birth control/ protection: None   Other Topics Concern  . Not on file   Social History Narrative  . No narrative on file    ROS  PHYSICAL EXAMINATION:    There were no vitals taken for this visit.    General appearance: alert, cooperative and appears stated age Neck: no adenopathy, supple, symmetrical, trachea midline and thyroid {CHL AMB PHY EX THYROID NORM DEFAULT:[phone]::"normal to inspection and palpation"} Breasts: {Exam; breast:13139::"normal appearance, no masses or tenderness"} Abdomen: soft, non-tender; bowel sounds normal; no masses,  no organomegaly  Pelvic: External genitalia:  no lesions  Urethra:  normal appearing urethra with no masses, tenderness or lesions              Bartholins and Skenes: normal                 Vagina: normal appearing vagina with normal color and discharge, no lesions              Cervix: {CHL AMB PHY EX CERVIX NORM DEFAULT:[id number]::"no lesions"}              Bimanual Exam:  Uterus:  {CHL AMB PHY EX UTERUS NORM DEFAULT:[phone]::"normal size, contour, position, consistency, mobility, non-tender"}              Adnexa: {CHL AMB PHY EX ADNEXA NO MASS DEFAULT:[id number]::"no mass, fullness, tenderness"}              Rectovaginal: {yes no:314532}.  Confirms.              Anus:  normal sphincter tone, no lesions  Chaperone was present for exam.  ASSESSMENT     PLAN    An After Visit Summary was printed and given to the patient.  *** minutes face to face time of which over 50% was spent in counseling.

## 2017-09-07 IMAGING — CR DG CHEST 2V
2 series · 2 of 2 positions shown · non-contrast
Comparison: None.

CLINICAL DATA: Cough for 4 weeks with flu symptoms.

EXAM:
CHEST  2 VIEW

[chest pa]
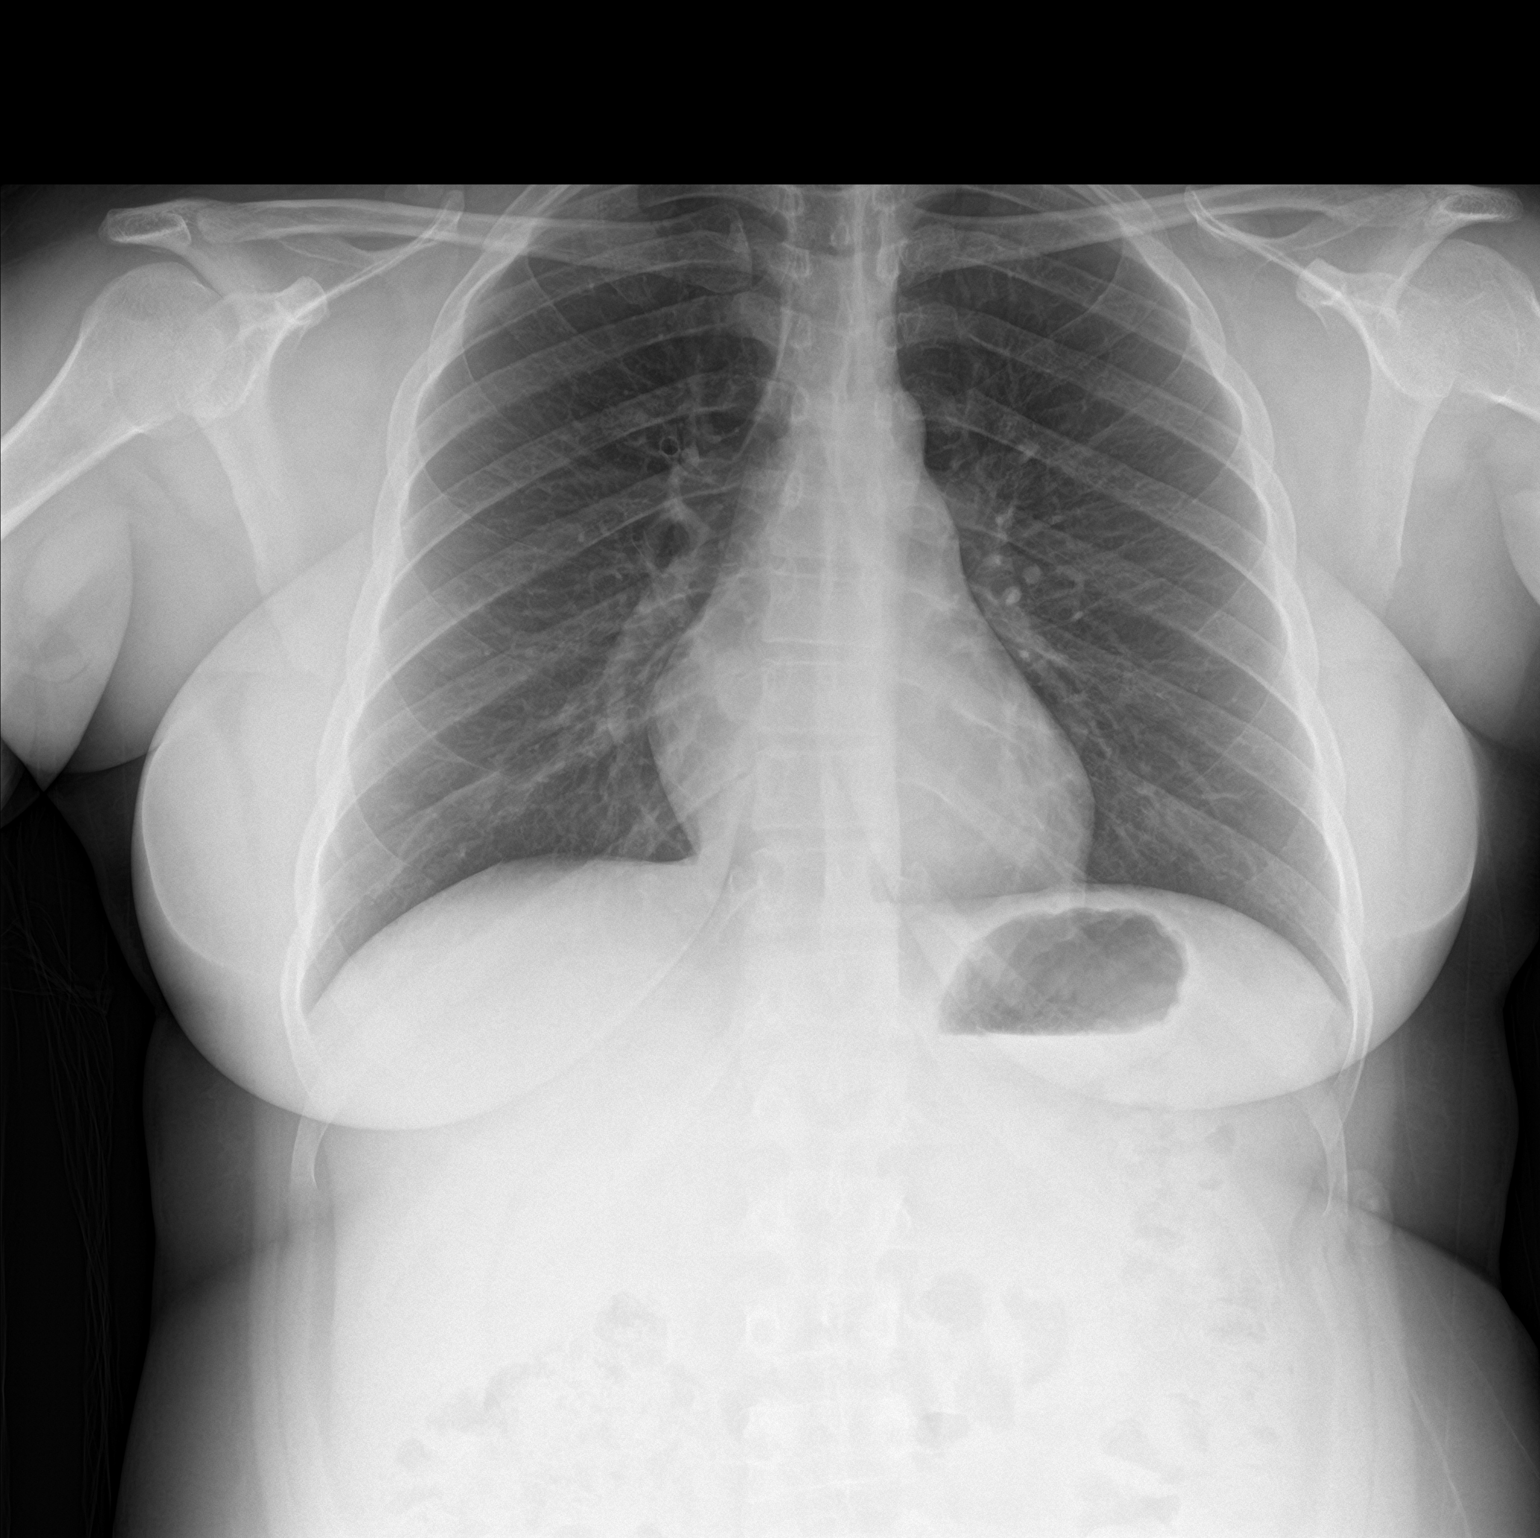

[chest lat]
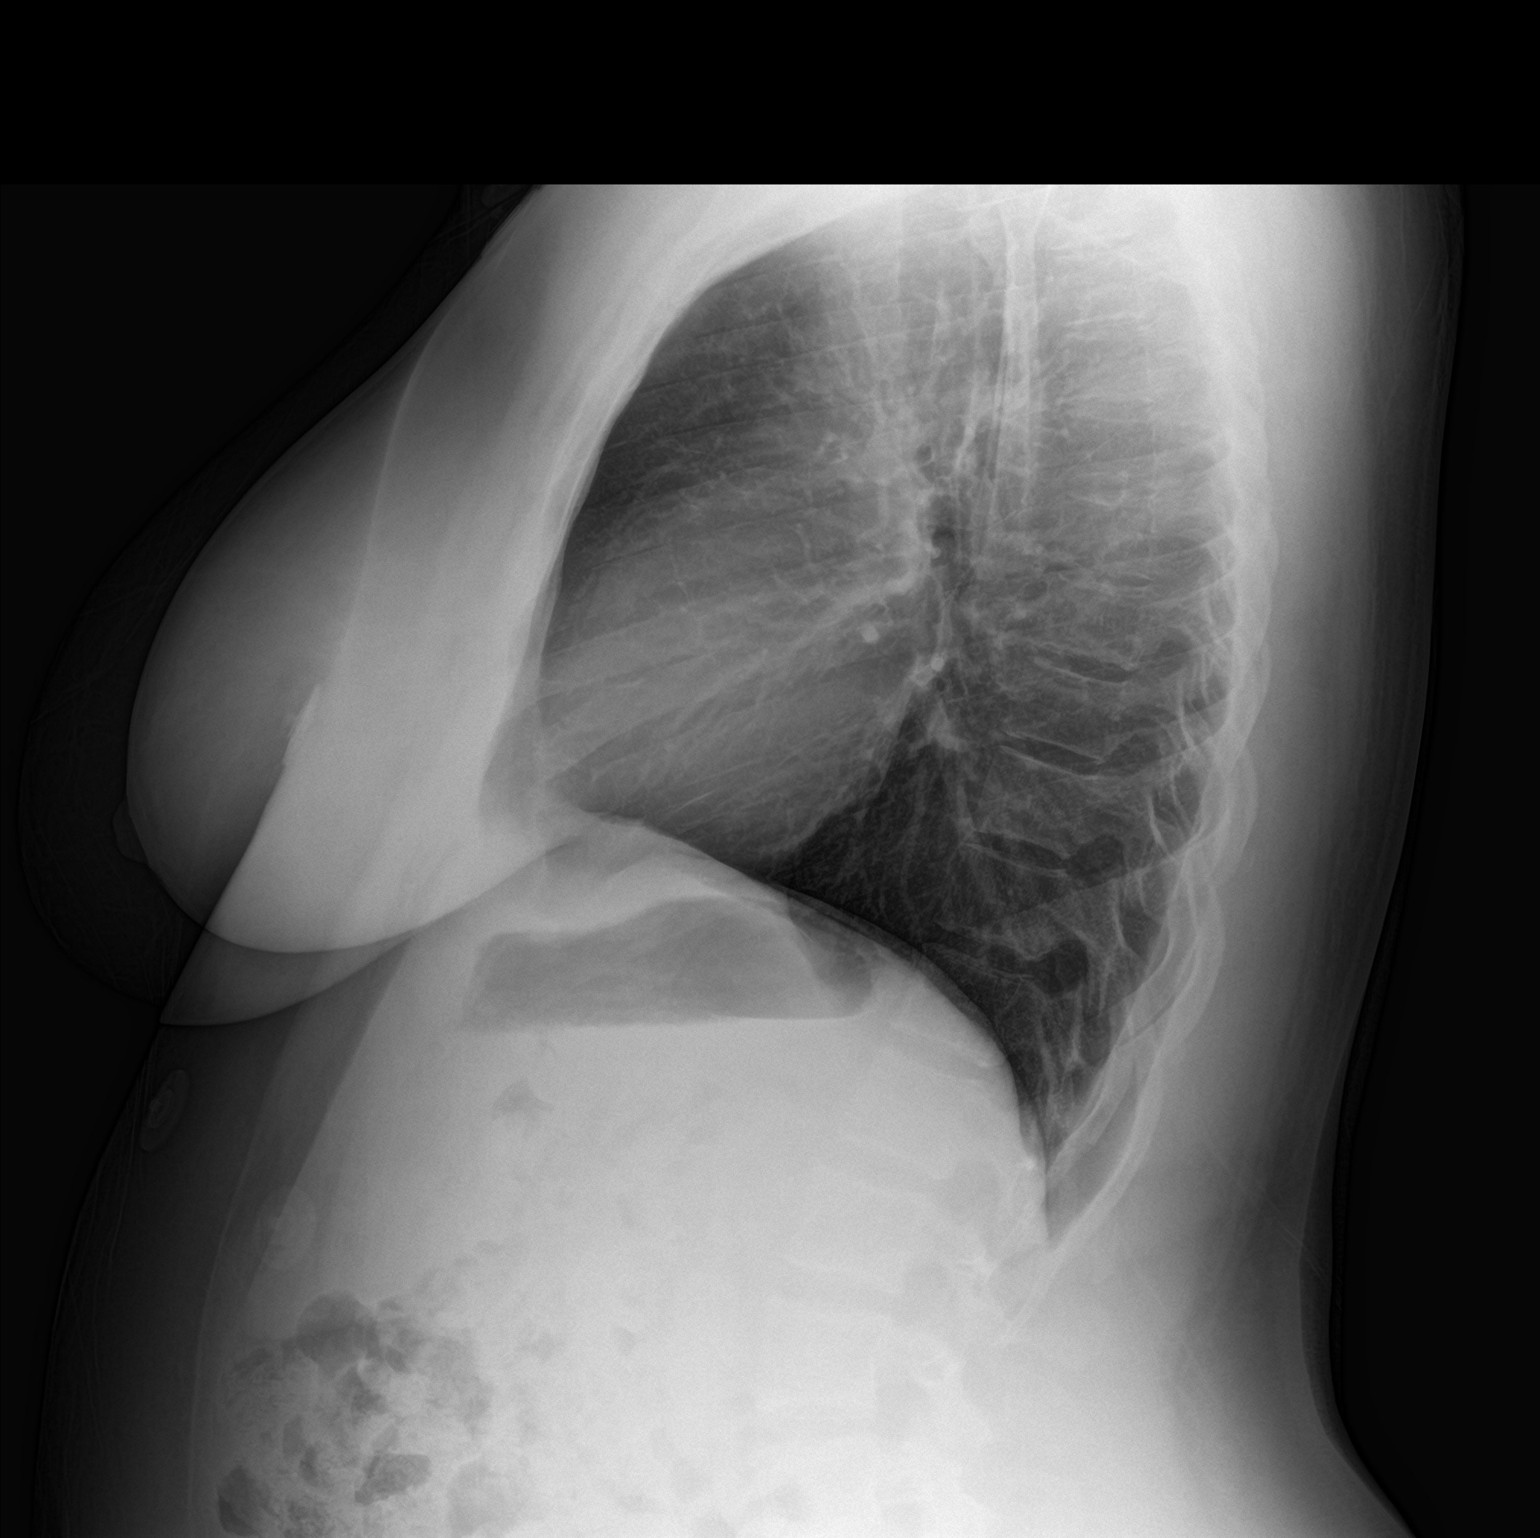

[2 of 2 positions shown; findings below may reference images not displayed]

FINDINGS: Normal heart size and mediastinal contours. No acute infiltrate or
edema. No effusion or pneumothorax. No acute osseous findings.
IMPRESSION: Negative chest

## 2017-09-18 ENCOUNTER — Encounter: Payer: Self-pay | Admitting: Obstetrics and Gynecology

## 2017-10-01 ENCOUNTER — Telehealth: Payer: Self-pay | Admitting: Obstetrics and Gynecology

## 2017-10-01 NOTE — Telephone Encounter (Signed)
Patient called requesting a call back from the nurse. She has an appointment for depo provera on 10/03/17 she may need to reschedule. She said she thinks she "may need a D&C."  Last seen: 07/18/17

## 2017-10-01 NOTE — Telephone Encounter (Signed)
Spoke with patient. Patient request to reschedule 10/31 depo provera inj. Last given 07/18/17; Due 10/31-11/14.  Patient states depo-provera worked the first time she received inj, now does not think it is as effective for endometriosis. Would like to discuss with Dr. Talbert Nan prior to next injection.   Scheduled for OV on 10/16/17 at 1:15pm with Dr. Talbert Nan. Patient declined earlier appt offered d/t school schedule. Patient verbalizes understanding and is agreeable.    Routing to provider for final review. Patient is agreeable to disposition. Will close encounter.

## 2017-10-03 ENCOUNTER — Ambulatory Visit: Payer: 59

## 2017-10-16 ENCOUNTER — Ambulatory Visit: Payer: Self-pay | Admitting: Obstetrics and Gynecology

## 2017-10-30 ENCOUNTER — Encounter: Payer: Self-pay | Admitting: Obstetrics and Gynecology

## 2017-10-30 ENCOUNTER — Other Ambulatory Visit: Payer: Self-pay

## 2017-10-30 ENCOUNTER — Ambulatory Visit: Payer: 59 | Admitting: Obstetrics and Gynecology

## 2017-10-30 VITALS — BP 138/86 | HR 92 | Resp 16 | Wt 175.0 lb

## 2017-10-30 DIAGNOSIS — R102 Pelvic and perineal pain: Secondary | ICD-10-CM | POA: Diagnosis not present

## 2017-10-30 DIAGNOSIS — N949 Unspecified condition associated with female genital organs and menstrual cycle: Secondary | ICD-10-CM | POA: Diagnosis not present

## 2017-10-30 DIAGNOSIS — N898 Other specified noninflammatory disorders of vagina: Secondary | ICD-10-CM | POA: Diagnosis not present

## 2017-10-30 DIAGNOSIS — N921 Excessive and frequent menstruation with irregular cycle: Secondary | ICD-10-CM | POA: Diagnosis not present

## 2017-10-30 DIAGNOSIS — K5900 Constipation, unspecified: Secondary | ICD-10-CM

## 2017-10-30 MED ORDER — ESTRADIOL 0.1 MG/24HR TD PTTW: 1.0000 | MEDICATED_PATCH | TRANSDERMAL | 0 refills | Status: DC

## 2017-10-30 MED ORDER — DOXYCYCLINE HYCLATE 100 MG PO CAPS: 100.0000 mg | ORAL_CAPSULE | Freq: Two times a day (BID) | ORAL | 0 refills | Status: DC

## 2017-10-30 NOTE — Addendum Note (Signed)
Addended by: Dorothy Spark on: 10/30/2017 01:30 PM   Modules accepted: Orders

## 2017-10-30 NOTE — Progress Notes (Addendum)
GYNECOLOGY  VISIT   HPI: 36 y.o.   Married  [place] American  female   [phone] with No LMP recorded. Patient has had an injection.   here to discuss birth control and dysmenorrhea    She was started on depo-provera in 5/18 for control of dysmenorrhea and dyspareunia. She has a h/o endometriosis with bilateral tubal blockage. Normal ultrasound in 5/18 other than a small fundal fibroid. She initially was doing fine. Since her visit in August she started having daily brown spotting. In the last 2 months she c/o intermittent sharp pelvic pain, comes and goes throughout the day every day. The pain feels like a contraction, last for a few seconds. Up to an 8/10 in severity. Occurring 5-6 x a day, sporadic. No triggers.  Bowel movement about every other day, comes out as pebbles, firm. Bowels have been like that for a year. No urinary c/o. Pain with intercourse had improved, now back, hasn't had sex since October secondary to pain and low libido.  She isn't a candidate for OCP's.  She had an ectopic pregnancy in 2014, at that surgery her endometriosis was discovered.  Cramps and dyspareunia developed after that surgery (over time). She has had an HSG with bilateral tubal blockage. The pain she feels is similar to the pain with the HSG.   GYNECOLOGIC HISTORY: No LMP recorded. Patient has had an injection. Contraception:none  Menopausal hormone therapy: none         OB History    Gravida Para Term Preterm AB Living   4 1 1   3 1    SAB TAB Ectopic Multiple Live Births   2   1   1          There are no active problems to display for this patient.   Past Medical History:  Diagnosis Date  . Anxiety   . Ectopic pregnancy   . Endometriosis   . Hypertension   . Miscarriage   . STD (sexually transmitted disease)    HSV II     Past Surgical History:  Procedure Laterality Date  . ECTOPIC PREGNANCY SURGERY  2015  . GYNECOLOGIC CRYOSURGERY      Current Outpatient Medications  Medication Sig  Dispense Refill  . acyclovir (ZOVIRAX) 400 MG tablet 1 tab po BID 180 tablet 3  . ADDERALL XR 20 MG 24 hr capsule Take 20 mg by mouth daily.   0  . Aspirin-Acetaminophen-Caffeine (GOODY HEADACHE PO) Take 1 packet by mouth every 8 (eight) hours as needed. For pain    . busPIRone (BUSPAR) 5 MG tablet Take 5 mg by mouth 2 (two) times daily.  3  . hydrochlorothiazide (HYDRODIURIL) 25 MG tablet Take 25 mg by mouth daily.  4  . meloxicam (MOBIC) 15 MG tablet Take 15 mg by mouth daily.    . Multiple Vitamins-Minerals (MULTIVITAMIN WITH MINERALS) tablet Take 1 tablet by mouth daily.    Marland Kitchen zolpidem (AMBIEN) 10 MG tablet TAKE 1 TABLET BY MOUTH NIGHTLY AT BEDTIME AS NEEDED SLEEP  0   No current facility-administered medications for this visit.      ALLERGIES: Patient has no known allergies.  Family History  Problem Relation Age of Onset  . Hypertension Mother   . Diabetes Mother   . Hypertension Father   . Diabetes Maternal Grandmother   . Hyperlipidemia Maternal Grandmother   . Heart failure Maternal Grandfather     Social History   Socioeconomic History  . Marital status: Married  Spouse name: Not on file  . Number of children: Not on file  . Years of education: Not on file  . Highest education level: Not on file  Social Needs  . Financial resource strain: Not on file  . Food insecurity - worry: Not on file  . Food insecurity - inability: Not on file  . Transportation needs - medical: Not on file  . Transportation needs - non-medical: Not on file  Occupational History  . Not on file  Tobacco Use  . Smoking status: Former Smoker    Types: Cigarettes  . Smokeless tobacco: Never Used  Substance and Sexual Activity  . Alcohol use: Yes    Alcohol/week: 4.2 oz    Types: 7 Glasses of wine per week  . Drug use: No  . Sexual activity: Yes    Partners: Male    Birth control/protection: Injection  Other Topics Concern  . Not on file  Social History Narrative  . Not on file     Review of Systems  Constitutional: Negative.   HENT: Negative.   Eyes: Negative.   Respiratory: Negative.   Cardiovascular: Negative.   Gastrointestinal: Negative.   Genitourinary: Negative.   Musculoskeletal: Negative.   Skin: Negative.   Neurological: Negative.   Endo/Heme/Allergies: Negative.   Psychiatric/Behavioral: Negative.     PHYSICAL EXAMINATION:    BP 138/86 (BP Location: Right Arm, Patient Position: Sitting, Cuff Size: Normal)   Pulse 92   Resp 16   Wt 175 lb (79.4 kg)   BMI 33.07 kg/m     General appearance: alert, cooperative and appears stated age Neck: no adenopathy, supple, symmetrical, trachea midline and thyroid normal to inspection and palpation Abdomen: soft, non-tender; non distended, no masses,  no organomegaly  Pelvic: External genitalia:  no lesions              Urethra:  normal appearing urethra with no masses, tenderness or lesions              Bartholins and Skenes: normal                 Vagina: normal appearing vagina with normal color and discharge, no lesions              Cervix: no lesions and +/-CMT              Bimanual Exam:  Uterus:  uterus normal sized, tender, mobile              Adnexa: no masses, tender on the right              Pelvic floor: not tender  Bladder: not tender  Chaperone was present for exam.  ASSESSMENT Break through bleeding on depo-provera, recent normal TSH and CBC Recurrent pelvic pain H/O endometriosis and bilateral tubal blockage (prior ectopic) Uterine tenderness, ?CMT, will check for STD and treat for possible endometritis Constipation, recommended daily miralax    PLAN Discussed option of continuing the depo-provera Not a candidate for OCP's Could try a mirena IUD Will treat with transdermal estrogen x 2 weeks Doxycycline for 10 days F/U in 2 weeks Also discussed option of laparoscopy with BS and treatment of endometriosis and TLH/BS as possible options if the pain doesn't improve    An  After Visit Summary was printed and given to the patient.  Over 30 minutes face to face time of which over 50% was spent in counseling.   Addendum: genprobe sent. During the exam an increase  in white watery vaginal d/c was noted. On questioning the patient c/o intermittent increase in vaginal d/c for the prior several months.

## 2017-11-01 LAB — VAGINITIS/VAGINOSIS, DNA PROBE
CANDIDA SPECIES: POSITIVE — AB
Gardnerella vaginalis: NEGATIVE
TRICHOMONAS VAG: NEGATIVE

## 2017-11-01 LAB — GC/CHLAMYDIA PROBE AMP
Chlamydia trachomatis, NAA: NEGATIVE
NEISSERIA GONORRHOEAE BY PCR: NEGATIVE

## 2017-11-02 ENCOUNTER — Other Ambulatory Visit: Payer: Self-pay | Admitting: *Deleted

## 2017-11-02 MED ORDER — FLUCONAZOLE 150 MG PO TABS: ORAL_TABLET | ORAL | 0 refills | Status: DC

## 2017-11-14 ENCOUNTER — Ambulatory Visit: Payer: 59 | Admitting: Obstetrics and Gynecology

## 2017-11-21 ENCOUNTER — Telehealth: Payer: Self-pay | Admitting: Obstetrics and Gynecology

## 2017-11-21 ENCOUNTER — Encounter: Payer: Self-pay | Admitting: Obstetrics and Gynecology

## 2017-11-21 ENCOUNTER — Other Ambulatory Visit: Payer: Self-pay

## 2017-11-21 ENCOUNTER — Ambulatory Visit (INDEPENDENT_AMBULATORY_CARE_PROVIDER_SITE_OTHER): Payer: 59 | Admitting: Obstetrics and Gynecology

## 2017-11-21 VITALS — BP 118/80 | HR 88 | Resp 16 | Ht 59.0 in | Wt 174.0 lb

## 2017-11-21 DIAGNOSIS — Z8742 Personal history of other diseases of the female genital tract: Secondary | ICD-10-CM

## 2017-11-21 DIAGNOSIS — R102 Pelvic and perineal pain: Secondary | ICD-10-CM

## 2017-11-21 DIAGNOSIS — N971 Female infertility of tubal origin: Secondary | ICD-10-CM

## 2017-11-21 NOTE — Telephone Encounter (Signed)
Spoke with patient regarding benefit for recommended surgery. Patient understood and agreeable. Advised patient we have a surgery date available for 12/10/17, patient declined to proceed with that surgery date. Patient advises she would need to schedule more toward the middle to latter part of January, but states she will call back to confirm. Patient aware this is professional benefit only. Patient is aware that once surgery is scheduled she will be contacted by hospital for separate benefits.    cc: Dr Talbert Nan  cc: Lamont Snowball, RN

## 2017-11-21 NOTE — Progress Notes (Addendum)
GYNECOLOGY  VISIT   HPI: 36 y.o.   Married  [place] American  female   [phone] with No LMP recorded. Patient has had an injection.   here for follow up pelvic pain. She was treated with antibiotics for a possible endometritis 2 weeks ago. She was also given a short course of estrogen for BTB with depo-provera.  She actually didn't continue with the depo-provera. She has a h/o tubal blockage. Her pain does feel like the pains when she had the HSG. She doesn't feel better s/p antibiotics. No consistent dyspareunia. She is leaning toward hysterectomy.     GYNECOLOGIC HISTORY: No LMP recorded. Patient has had an injection. Contraception:infertility. Menopausal hormone therapy: none         OB History    Gravida Para Term Preterm AB Living   4 1 1   3 1    SAB TAB Ectopic Multiple Live Births   2   1   1          There are no active problems to display for this patient.   Past Medical History:  Diagnosis Date  . Anxiety   . Ectopic pregnancy   . Endometriosis   . Hypertension   . Miscarriage   . STD (sexually transmitted disease)    HSV II     Past Surgical History:  Procedure Laterality Date  . ECTOPIC PREGNANCY SURGERY  2015  . GYNECOLOGIC CRYOSURGERY      Current Outpatient Medications  Medication Sig Dispense Refill  . acyclovir (ZOVIRAX) 400 MG tablet 1 tab po BID 180 tablet 3  . ADDERALL XR 20 MG 24 hr capsule Take 20 mg by mouth daily.   0  . Aspirin-Acetaminophen-Caffeine (GOODY HEADACHE PO) Take 1 packet by mouth every 8 (eight) hours as needed. For pain    . busPIRone (BUSPAR) 5 MG tablet Take 5 mg by mouth 2 (two) times daily.  3  . doxycycline (VIBRAMYCIN) 100 MG capsule Take 1 capsule (100 mg total) by mouth 2 (two) times daily. Take BID for 10 days.  Take with food as can cause GI distress. 20 capsule 0  . estradiol (VIVELLE-DOT) 0.1 MG/24HR patch Place 1 patch (0.1 mg total) onto the skin 2 (two) times a week. 8 patch 0  . fluconazole (DIFLUCAN) 150 MG tablet  Take one tablet now po, may repeat in 72 hours if symptoms not resolved. 2 tablet 0  . hydrochlorothiazide (HYDRODIURIL) 25 MG tablet Take 25 mg by mouth daily.  4  . meloxicam (MOBIC) 15 MG tablet Take 15 mg by mouth daily.    . Multiple Vitamins-Minerals (MULTIVITAMIN WITH MINERALS) tablet Take 1 tablet by mouth daily.    Marland Kitchen zolpidem (AMBIEN) 10 MG tablet TAKE 1 TABLET BY MOUTH NIGHTLY AT BEDTIME AS NEEDED SLEEP  0   No current facility-administered medications for this visit.      ALLERGIES: Patient has no known allergies.  Family History  Problem Relation Age of Onset  . Hypertension Mother   . Diabetes Mother   . Hypertension Father   . Diabetes Maternal Grandmother   . Hyperlipidemia Maternal Grandmother   . Heart failure Maternal Grandfather     Social History   Socioeconomic History  . Marital status: Married    Spouse name: Not on file  . Number of children: Not on file  . Years of education: Not on file  . Highest education level: Not on file  Social Needs  . Financial resource strain: Not on file  .  Food insecurity - worry: Not on file  . Food insecurity - inability: Not on file  . Transportation needs - medical: Not on file  . Transportation needs - non-medical: Not on file  Occupational History  . Not on file  Tobacco Use  . Smoking status: Former Smoker    Types: Cigarettes  . Smokeless tobacco: Never Used  Substance and Sexual Activity  . Alcohol use: Yes    Alcohol/week: 4.2 oz    Types: 7 Glasses of wine per week  . Drug use: No  . Sexual activity: Yes    Partners: Male    Birth control/protection: Injection  Other Topics Concern  . Not on file  Social History Narrative  . Not on file    ROS  PHYSICAL EXAMINATION:    There were no vitals taken for this visit.    General appearance: alert, cooperative and appears stated age  Pelvic: External genitalia:  no lesions              Urethra:  normal appearing urethra with no masses, tenderness  or lesions              Bartholins and Skenes: normal                 Cervix: no cervical motion tenderness              Bimanual Exam:  Uterus:  anteverted, mobile, normal sized, slightly irregular, not tender              Adnexa: no mass, fullness, tenderness              Pelvic floor: not tender  Chaperone was present for exam.  ASSESSMENT Chronic pelvic pain, h/o endometriosis, bilateral tubal blockage. No help with depo-provera, can't take OCP's    PLAN We have talked about the option of diagnostic laparoscopy, treatment of endometriosis and BS vs TLH/BS/treatment of endometriosis She desires TLH/BS/Cystoscopy. Reviewed the surgery with the patient and her partner, discussed the risks She had concerns about clots, we discussed lovenox Will return for a pre-op   An After Visit Summary was printed and given to the patient.  ~20 minutes face to face time of which over 50% was spent in counseling.   Addendum: labs from primary MD reviewed ANA Positive, with low antibody level ALT mildly elevated at 31, normal up to 29 Normal creatinine, normal sed rate, normal glucose Negative Rheumatoid factor  Addendum: the patient's surgery from 9/14 was reviewed, limited description other than comment about the ectopic, no mention of endometriosis, or any scar tissue. The patient had a normal u/s in 5/18 other than a small myoma.

## 2017-11-21 NOTE — Progress Notes (Signed)
Review of Systems   Constitutional: Negative.    HENT: Negative.    Eyes: Negative.    Respiratory: Negative.    Cardiovascular: Negative.    Gastrointestinal: Negative.    Genitourinary: Negative.    Musculoskeletal: Negative.    Skin: Negative.    Neurological: Negative.    Endo/Heme/Allergies: Negative.    Psychiatric/Behavioral: Negative.

## 2017-12-18 ENCOUNTER — Telehealth: Payer: Self-pay | Admitting: Obstetrics and Gynecology

## 2017-12-18 NOTE — Telephone Encounter (Signed)
Return call to patient. Date options discussed. Desires to proceed on 2-25-1-. Will schedule and call back once confirmed.

## 2017-12-18 NOTE — Telephone Encounter (Signed)
Call to patient. Advised surgery scheduled for 01-28-18 at 0730 at [facility]. Patient currently driving so will call back to complete details.

## 2017-12-18 NOTE — Telephone Encounter (Signed)
Patient is ready to proceed with scheduling surgery. Surgery prepay complete. Patient is very anxious to schedule in order to give her employer surgery dates.

## 2017-12-19 NOTE — Telephone Encounter (Signed)
Call from patient approximately 1030 am. Surgery instruction sheet reviewed and printed copy will be mailed to patient.  Bowel prep instructions provided for day before surgery. Patient will drop off disability forms for completion.  Question answered and consult scheduled for 01-10-18.   Routing to provider for final review. Patient agreeable to disposition. Will close encounter.

## 2017-12-21 ENCOUNTER — Telehealth: Payer: Self-pay | Admitting: *Deleted

## 2017-12-21 NOTE — Telephone Encounter (Signed)
Call to patient. Advised Dr Talbert Nan recommends PCP clearance for surgery due to positive ANA on recent labs.  Patient states she has been referred to rheumatology.  Advised to call PCP today to determine what steps will be needed for surgical clearance.

## 2017-12-26 NOTE — Telephone Encounter (Signed)
I have received a message from [place], stating a representative from [employer] called and left a message on our voicemail 12/25/17 at 7:08 PM EST, in reference to our pre-certification request for surgery scheduled on 01/28/18. I have returned the call to [employer] (I am unable to understand the representatives name) [phone] extension [phone]. I have left her a message requesting a return call to [place]. Advised in the message if I am not available, to then ask for the Nurse Supervisor, Gay Filler.    cc: Lamont Snowball, RN

## 2018-01-01 ENCOUNTER — Telehealth: Payer: Self-pay | Admitting: Obstetrics and Gynecology

## 2018-01-01 NOTE — Telephone Encounter (Signed)
Disability Form in regards to scheduled surgery on 01/28/18, has been forwarded to Dr Talbert Nan to review and sign    cc: Dr Talbert Nan

## 2018-01-02 NOTE — Telephone Encounter (Signed)
Returned call to patient. Advised patient prior approval has been obtained for scheduled surgery on 01/28/18. Patient also advised she has her surgery clearance letter, and will fax to our office to the attention of Lamont Snowball, RN.    Routing to Lamont Snowball, RN

## 2018-01-02 NOTE — Telephone Encounter (Signed)
Letter received and sent to your office for review.  Routing to Dr Talbert Nan. Encounter closed.

## 2018-01-02 NOTE — Telephone Encounter (Signed)
Patient said she is retuning a call to [place].

## 2018-01-10 ENCOUNTER — Encounter: Payer: Self-pay | Admitting: Obstetrics and Gynecology

## 2018-01-10 ENCOUNTER — Other Ambulatory Visit: Payer: Self-pay

## 2018-01-10 ENCOUNTER — Ambulatory Visit: Payer: 59 | Admitting: Obstetrics and Gynecology

## 2018-01-10 VITALS — BP 122/80 | HR 88 | Resp 14 | Wt 175.0 lb

## 2018-01-10 DIAGNOSIS — N946 Dysmenorrhea, unspecified: Secondary | ICD-10-CM

## 2018-01-10 DIAGNOSIS — R102 Pelvic and perineal pain: Secondary | ICD-10-CM

## 2018-01-10 DIAGNOSIS — N979 Female infertility, unspecified: Secondary | ICD-10-CM

## 2018-01-10 DIAGNOSIS — G8929 Other chronic pain: Secondary | ICD-10-CM

## 2018-01-10 DIAGNOSIS — N809 Endometriosis, unspecified: Secondary | ICD-10-CM

## 2018-01-10 DIAGNOSIS — N941 Unspecified dyspareunia: Secondary | ICD-10-CM

## 2018-01-10 NOTE — Progress Notes (Signed)
GYNECOLOGY  VISIT   HPI: 37 y.o.   Married  [place] American  female   [phone] with No LMP recorded (lmp unknown). Patient is not currently having periods (Reason: Other).  Here for surgery consult. Patient is scheduled for [facility] 01-28-18 for chronic pelvic pain, dysmenorrhea, dyspareunia. She has a h/o infertility, endometriosis and tubal blockage (prior ectopic). She was started on depo-provera in 5/18, but had spotting and continued pelvic pain. She had her last depo-shot in 10/18. She isn't a candidate for OCP's, declines an IUD and desires definitive surgery. She has had a normal ultrasound, negative STD testing, last pap was in 2/18 and was normal.  the patient's surgery from 9/14 was reviewed, limited description other than comment about the ectopic, no mention of endometriosis, or any scar tissue. Only mentions endometriosis in the post op diagnosis.  The patient had a normal u/s in 5/18 other than a small myoma.   She has had a recent + ANA screen, has an appointment with rheumatology, primary has cleared her for surgery.  GYNECOLOGIC HISTORY: No LMP recorded (lmp unknown). Patient is not currently having periods (Reason: Other). Contraception:none Menopausal hormone therapy: none        OB History    Gravida Para Term Preterm AB Living   4 1 1   3 1    SAB TAB Ectopic Multiple Live Births   2   1   1          There are no active problems to display for this patient.   Past Medical History:  Diagnosis Date  . Anxiety   . Ectopic pregnancy   . Endometriosis   . Hypertension   . Miscarriage   . STD (sexually transmitted disease)    HSV II     Past Surgical History:  Procedure Laterality Date  . ECTOPIC PREGNANCY SURGERY  2015  . GYNECOLOGIC CRYOSURGERY      Current Outpatient Medications  Medication Sig Dispense Refill  . acyclovir (ZOVIRAX) 400 MG tablet 1 tab po BID 180 tablet 3  . ADDERALL XR 20 MG 24 hr capsule Take 20 mg by mouth daily.   0  .  Aspirin-Acetaminophen-Caffeine (GOODY HEADACHE PO) Take 1 packet by mouth every 8 (eight) hours as needed. For pain    . busPIRone (BUSPAR) 5 MG tablet Take 5 mg by mouth 2 (two) times daily.  3  . hydrochlorothiazide (HYDRODIURIL) 25 MG tablet Take 25 mg by mouth daily.  4  . meloxicam (MOBIC) 15 MG tablet Take 15 mg by mouth daily.    . Multiple Vitamins-Minerals (MULTIVITAMIN WITH MINERALS) tablet Take 1 tablet by mouth daily.    Marland Kitchen zolpidem (AMBIEN) 10 MG tablet TAKE 1 TABLET BY MOUTH NIGHTLY AT BEDTIME AS NEEDED SLEEP  0   No current facility-administered medications for this visit.      ALLERGIES: Patient has no known allergies.  Family History  Problem Relation Age of Onset  . Hypertension Mother   . Diabetes Mother   . Hypertension Father   . Diabetes Maternal Grandmother   . Hyperlipidemia Maternal Grandmother   . Heart failure Maternal Grandfather     Social History   Socioeconomic History  . Marital status: Married    Spouse name: Not on file  . Number of children: Not on file  . Years of education: Not on file  . Highest education level: Not on file  Social Needs  . Financial resource strain: Not on file  . Food insecurity -  worry: Not on file  . Food insecurity - inability: Not on file  . Transportation needs - medical: Not on file  . Transportation needs - non-medical: Not on file  Occupational History  . Not on file  Tobacco Use  . Smoking status: Former Smoker    Types: Cigarettes  . Smokeless tobacco: Never Used  Substance and Sexual Activity  . Alcohol use: Yes    Alcohol/week: 4.2 oz    Types: 7 Glasses of wine per week  . Drug use: No  . Sexual activity: Yes    Partners: Male    Birth control/protection: Injection  Other Topics Concern  . Not on file  Social History Narrative  . Not on file    Review of Systems  Constitutional: Negative.   HENT: Negative.   Eyes: Negative.   Respiratory: Negative.   Cardiovascular: Negative.    Gastrointestinal: Negative.   Genitourinary: Negative.   Musculoskeletal: Negative.   Skin: Negative.   Neurological: Negative.   Endo/Heme/Allergies: Negative.   Psychiatric/Behavioral: Negative.     PHYSICAL EXAMINATION:    BP 122/80 (BP Location: Right Arm, Patient Position: Sitting, Cuff Size: Normal)   Pulse 88   Resp 14   Wt 175 lb (79.4 kg)   LMP  (LMP Unknown)   BMI 35.35 kg/m     General appearance: alert, cooperative and appears stated age Neck: no adenopathy, supple, symmetrical, trachea midline and thyroid normal to inspection and palpation Heart: regular rate and rhythm Lungs: CTAB Abdomen: soft, non-tender; bowel sounds normal; no masses,  no organomegaly Extremities: normal, atraumatic, no cyanosis Skin: normal color, texture and turgor, no rashes or lesions Lymph: normal cervical supraclavicular and inguinal nodes Neurologic: grossly normal    ASSESSMENT Chronic pelvic pain, dysmenorrhea, dyspareunia. Depo-provera didn't help, not a candidate for OCP's, declines the mirena IUD. Desires definitive surgery  H/O endometriosis, infertility and tubal blockage H/O HTN, recent +ANA screen    PLAN Discussed total laparoscopic hysterectomy, bilateral salpingectomy, treatment of endometriosis and cystoscopy. Reviewed the risks of the procedure, including infection, bleeding, damage to bowel/badder/vessels/ureters.  Discussed the possible need for laparotomy. Discussed post operative recovery. All of her questions were answered. Her husband was present for the discussion. Patient has been cleared for surgery by her primary. She has had a normal EKG, lab work also done (normal renal function, minimally elevated ALT)   An After Visit Summary was printed and given to the patient.  CC: Volney Presser, F-NP

## 2018-01-14 ENCOUNTER — Ambulatory Visit: Payer: 59 | Admitting: Obstetrics and Gynecology

## 2018-01-15 NOTE — Patient Instructions (Addendum)
Your procedure is scheduled on: Monday, Feb  25  Enter through the Main Entrance of [facility] at:6 am  Pick up the phone at the desk and dial [phone].  Call this number if you have problems the morning of surgery: [phone].  Remember: Do NOT eat or Do NOT drink clear liquids (including water) after midnight Sunday  Take these medicines the morning of surgery with a SIP OF WATER:  Acyclovir and buspar  Stop herbal medications and supplements at this time.  Do NOT wear jewelry (body piercing), metal hair clips/bobby pins, make-up, or nail polish. Do NOT wear lotions, powders, or perfumes.  You may wear deoderant. Do NOT shave for 48 hours prior to surgery. Do NOT bring valuables to the hospital. Contacts may not be worn into surgery.  Leave suitcase in car.  After surgery it may be brought to your room.  For patients admitted to the hospital, checkout time is 11:00 AM the day of discharge. Have a responsible adult drive you home and stay with you for 24 hours after your procedure.  Home with Claudia Ball.

## 2018-01-17 NOTE — H&P (Signed)
GYNECOLOGY  VISIT   HPI: 37 y.o.   Married  [place] American  female   [phone] with No LMP recorded (lmp unknown). Patient is not currently having periods (Reason: Other).  Here for surgery consult. Patient is scheduled for [facility] 01-28-18 for chronic pelvic pain, dysmenorrhea, dyspareunia. She has a h/o infertility, endometriosis and tubal blockage (prior ectopic). She was started on depo-provera in 5/18, but had spotting and continued pelvic pain. She had her last depo-shot in 10/18. She isn't a candidate for OCP's, declines an IUD and desires definitive surgery. She has had a normal ultrasound, negative STD testing, last pap was in 2/18 and was normal.  the patient's surgery from 9/14 was reviewed, limited description other than comment about the ectopic, no mention of endometriosis, or any scar tissue. Only mentions endometriosis in the post op diagnosis.  The patient had a normal u/s in 5/18 other than a small myoma.  She has had a recent + ANA screen, has an appointment with rheumatology, primary has cleared her for surgery.  GYNECOLOGIC HISTORY: No LMP recorded (lmp unknown). Patient is not currently having periods (Reason: Other). Contraception:none Menopausal hormone therapy: none                OB History    Gravida Para Term Preterm AB Living   4 1 1   3 1    SAB TAB Ectopic Multiple Live Births   2   1   1          There are no active problems to display for this patient.       Past Medical History:  Diagnosis Date  . Anxiety   . Ectopic pregnancy   . Endometriosis   . Hypertension   . Miscarriage   . STD (sexually transmitted disease)    HSV II          Past Surgical History:  Procedure Laterality Date  . ECTOPIC PREGNANCY SURGERY  2015  . GYNECOLOGIC CRYOSURGERY            Current Outpatient Medications  Medication Sig Dispense Refill  . acyclovir (ZOVIRAX) 400 MG tablet 1 tab po BID 180 tablet 3  . ADDERALL XR 20 MG 24 hr capsule  Take 20 mg by mouth daily.   0  . Aspirin-Acetaminophen-Caffeine (GOODY HEADACHE PO) Take 1 packet by mouth every 8 (eight) hours as needed. For pain    . busPIRone (BUSPAR) 5 MG tablet Take 5 mg by mouth 2 (two) times daily.  3  . hydrochlorothiazide (HYDRODIURIL) 25 MG tablet Take 25 mg by mouth daily.  4  . meloxicam (MOBIC) 15 MG tablet Take 15 mg by mouth daily.    . Multiple Vitamins-Minerals (MULTIVITAMIN WITH MINERALS) tablet Take 1 tablet by mouth daily.    Marland Kitchen zolpidem (AMBIEN) 10 MG tablet TAKE 1 TABLET BY MOUTH NIGHTLY AT BEDTIME AS NEEDED SLEEP  0   No current facility-administered medications for this visit.      ALLERGIES: Patient has no known allergies.       Family History  Problem Relation Age of Onset  . Hypertension Mother   . Diabetes Mother   . Hypertension Father   . Diabetes Maternal Grandmother   . Hyperlipidemia Maternal Grandmother   . Heart failure Maternal Grandfather     Social History        Socioeconomic History  . Marital status: Married    Spouse name: Not on file  . Number of children: Not on file  .  Years of education: Not on file  . Highest education level: Not on file  Social Needs  . Financial resource strain: Not on file  . Food insecurity - worry: Not on file  . Food insecurity - inability: Not on file  . Transportation needs - medical: Not on file  . Transportation needs - non-medical: Not on file  Occupational History  . Not on file  Tobacco Use  . Smoking status: Former Smoker    Types: Cigarettes  . Smokeless tobacco: Never Used  Substance and Sexual Activity  . Alcohol use: Yes    Alcohol/week: 4.2 oz    Types: 7 Glasses of wine per week  . Drug use: No  . Sexual activity: Yes    Partners: Male    Birth control/protection: Injection  Other Topics Concern  . Not on file  Social History Narrative  . Not on file    Review of Systems  Constitutional: Negative.   HENT: Negative.    Eyes: Negative.   Respiratory: Negative.   Cardiovascular: Negative.   Gastrointestinal: Negative.   Genitourinary: Negative.   Musculoskeletal: Negative.   Skin: Negative.   Neurological: Negative.   Endo/Heme/Allergies: Negative.   Psychiatric/Behavioral: Negative.     PHYSICAL EXAMINATION:    BP 122/80 (BP Location: Right Arm, Patient Position: Sitting, Cuff Size: Normal)   Pulse 88   Resp 14   Wt 175 lb (79.4 kg)   LMP  (LMP Unknown)   BMI 35.35 kg/m     General appearance: alert, cooperative and appears stated age Neck: no adenopathy, supple, symmetrical, trachea midline and thyroid normal to inspection and palpation Heart: regular rate and rhythm Lungs: CTAB Abdomen: soft, non-tender; bowel sounds normal; no masses,  no organomegaly Extremities: normal, atraumatic, no cyanosis Skin: normal color, texture and turgor, no rashes or lesions Lymph: normal cervical supraclavicular and inguinal nodes Neurologic: grossly normal    ASSESSMENT Chronic pelvic pain, dysmenorrhea, dyspareunia. Depo-provera didn't help, not a candidate for OCP's, declines the mirena IUD. Desires definitive surgery  H/O endometriosis, infertility and tubal blockage H/O HTN, recent +ANA screen    PLAN Discussed total laparoscopic hysterectomy, bilateral salpingectomy, treatment of endometriosis and cystoscopy. Reviewed the risks of the procedure, including infection, bleeding, damage to bowel/badder/vessels/ureters.  Discussed the possible need for laparotomy. Discussed post operative recovery. All of her questions were answered. Her husband was present for the discussion. Patient has been cleared for surgery by her primary. She has had a normal EKG, lab work also done (normal renal function, minimally elevated ALT)   An After Visit Summary was printed and given to the patient.  CC: Volney Presser, F-NP

## 2018-01-18 ENCOUNTER — Encounter (HOSPITAL_COMMUNITY): Payer: Self-pay

## 2018-01-18 ENCOUNTER — Encounter (HOSPITAL_COMMUNITY)
Admission: RE | Admit: 2018-01-18 | Discharge: 2018-01-18 | Disposition: A | Discharge status: Home / Self Care | Payer: 59 | Source: Ambulatory Visit | Attending: Obstetrics and Gynecology | Admitting: Obstetrics and Gynecology

## 2018-01-18 ENCOUNTER — Other Ambulatory Visit: Payer: Self-pay

## 2018-01-18 DIAGNOSIS — Z01812 Encounter for preprocedural laboratory examination: Secondary | ICD-10-CM | POA: Insufficient documentation

## 2018-01-18 HISTORY — DX: Headache: R51

## 2018-01-18 HISTORY — DX: Bronchitis, not specified as acute or chronic: J40

## 2018-01-18 HISTORY — DX: Headache, unspecified: R51.9

## 2018-01-18 HISTORY — DX: Gastro-esophageal reflux disease without esophagitis: K21.9

## 2018-01-18 HISTORY — DX: Personal history of nicotine dependence: Z87.891

## 2018-01-18 HISTORY — DX: Hyperlipidemia, unspecified: E78.5

## 2018-01-18 HISTORY — DX: Other seasonal allergic rhinitis: J30.2

## 2018-01-18 LAB — COMPREHENSIVE METABOLIC PANEL
ALK PHOS: 58 U/L (ref 38–126)
ALT: 26 U/L (ref 14–54)
AST: 18 U/L (ref 15–41)
Albumin: 3.8 g/dL (ref 3.5–5.0)
Anion gap: 11 (ref 5–15)
BUN: 19 mg/dL (ref 6–20)
CO2: 24 mmol/L (ref 22–32)
CREATININE: 0.91 mg/dL (ref 0.44–1.00)
Calcium: 9.2 mg/dL (ref 8.9–10.3)
Chloride: 105 mmol/L (ref 101–111)
GFR calc Af Amer: >60 mL/min (ref >60)
Glucose, Bld: 111 mg/dL — ABNORMAL HIGH (ref 65–99)
Potassium: 3.6 mmol/L (ref 3.5–5.1)
Sodium: 140 mmol/L (ref 135–145)
Total Bilirubin: 0.7 mg/dL (ref 0.3–1.2)
Total Protein: 7.7 g/dL (ref 6.5–8.1)

## 2018-01-18 LAB — CBC
HEMATOCRIT: 40.5 % (ref 36.0–46.0)
HEMOGLOBIN: 13.6 g/dL (ref 12.0–15.0)
MCH: 31.1 pg (ref 26.0–34.0)
MCHC: 33.6 g/dL (ref 30.0–36.0)
MCV: 92.5 fL (ref 78.0–100.0)
Platelets: 284 10*3/uL (ref 150–400)
RBC: 4.38 MIL/uL (ref 3.87–5.11)
RDW: 13.7 % (ref 11.5–15.5)
WBC: 11.6 10*3/uL — AB (ref 4.0–10.5)

## 2018-01-21 ENCOUNTER — Telehealth: Payer: Self-pay | Admitting: *Deleted

## 2018-01-21 LAB — HEMOGLOBIN A1C
Hgb A1c MFr Bld: 6 % — ABNORMAL HIGH (ref 4.8–5.6)
MEAN PLASMA GLUCOSE: 125.5 mg/dL

## 2018-01-21 NOTE — Telephone Encounter (Signed)
-----   Message from Nunzio Cobbs, MD sent at 01/19/2018  6:44 AM EST ----- This is Dr. Quincy Simmonds reviewing Dr. Gentry Fitz labs while she is out of the office.   Please contact patient with preop results. Her glucose level was elevated. Add hemoglobin A1C to the patient's labs at the hospital.  If this is not possible, she needs to come to the office to have this drawn ASAP.  Her WBC was also slightly elevated. Please check if she has recently had an infection - URI, etc.  Cc- Lamont Snowball

## 2018-01-21 NOTE — Telephone Encounter (Signed)
HgbA1C is 6.0.   Dr. Talbert Nan to review preop labs and make any final recommendations.   Cc- Dr. Talbert Nan

## 2018-01-21 NOTE — Telephone Encounter (Signed)
Notes recorded by Burnice Logan, RN on 01/21/2018 at 2:43 PM EST Spoke with patient, advised as seen below per Dr. Quincy Simmonds. Patient denies any recent or current infections. Patient states she had a recent positive ANA titer and was seen by Rheumatology. Patient states she was advised this may increase WBC. Advised patient will review with Dr. Quincy Simmonds, will return call once additional testing results and is reviewed. Patient verbalizes understanding and is agreeable.   See telephone encounter dated 01/21/18 to review with provider. ------  Notes recorded by Burnice Logan, RN on 01/21/2018 at 2:35 PM EST [address] [facility] preop nurse Arbie Cookey returned call, left message with Lavella Hammock. Advised will add HgbA1c to previously collected labs. ------  Notes recorded by Burnice Logan, RN on 01/21/2018 at 2:07 PM EST Spoke with Arbie Cookey, [facility] nurse, will check with lab regarding adding HgbA1c and return call.   Routing to Dr. Quincy Simmonds.  Cc: Dr. Talbert Nan

## 2018-01-22 NOTE — Telephone Encounter (Signed)
Please inform the patient that she has pre-diabetes. This will not postpone her surgery, but I would recommend she eat a mediterranean diet, exercise regularly and loose weight.  A mediterranean diet is high in fruits, vegetables, whole grains, fish, chicken, nuts, healthy fats (olive oil or canola oil). Low fat dairy. Limit butter, margarine, red meat and sweets.  Please send a copy of her lab work to her primary MD

## 2018-01-23 NOTE — Telephone Encounter (Signed)
Spoke with patient and gave results and recommendations. Patient voiced understanding -eh

## 2018-01-27 ENCOUNTER — Encounter (HOSPITAL_COMMUNITY): Payer: Self-pay | Admitting: Anesthesiology

## 2018-01-27 NOTE — Anesthesia Preprocedure Evaluation (Addendum)
Anesthesia Evaluation  Patient identified by MRN, date of birth, ID band Patient awake    Reviewed: Allergy & Precautions, NPO status , Patient's Chart, lab work & pertinent test results  Airway Mallampati: III  TM Distance: >3 FB Neck ROM: Full    Dental no notable dental hx. (+) Teeth Intact   Pulmonary former smoker,    Pulmonary exam normal breath sounds clear to auscultation       Cardiovascular hypertension, Pt. on medications Normal cardiovascular exam Rhythm:Regular Rate:Normal     Neuro/Psych  Headaches, Anxiety ADHD   GI/Hepatic Neg liver ROS, GERD  Controlled and Medicated,  Endo/Other  Obesity Hyperlipidemia-diet controlled  Renal/GU negative Renal ROS  negative genitourinary   Musculoskeletal negative musculoskeletal ROS (+)   Abdominal (+) + obese,   Peds  Hematology negative hematology ROS (+)   Anesthesia Other Findings   Reproductive/Obstetrics Pelvic pain  Hx/o endometriosis HSV                            Anesthesia Physical Anesthesia Plan  ASA: II  Anesthesia Plan: General   Post-op Pain Management:    Induction: Intravenous  PONV Risk Score and Plan: 4 or greater and Scopolamine patch - Pre-op, Midazolam, Dexamethasone, Ondansetron and Treatment may vary due to age or medical condition  Airway Management Planned: Oral ETT  Additional Equipment:   Intra-op Plan:   Post-operative Plan: Extubation in OR  Informed Consent: I have reviewed the patients History and Physical, chart, labs and discussed the procedure including the risks, benefits and alternatives for the proposed anesthesia with the patient or authorized representative who has indicated his/her understanding and acceptance.   Dental advisory given  Plan Discussed with: CRNA, Anesthesiologist and Surgeon  Anesthesia Plan Comments:        Anesthesia Quick Evaluation

## 2018-01-28 ENCOUNTER — Other Ambulatory Visit: Payer: Self-pay

## 2018-01-28 ENCOUNTER — Ambulatory Visit (HOSPITAL_COMMUNITY): Payer: 59 | Admitting: Anesthesiology

## 2018-01-28 ENCOUNTER — Encounter (HOSPITAL_COMMUNITY): Payer: Self-pay

## 2018-01-28 ENCOUNTER — Ambulatory Visit (HOSPITAL_COMMUNITY)
Admission: AD | Admit: 2018-01-28 | Discharge: 2018-01-29 | Disposition: A | Discharge status: Home / Self Care | Payer: 59 | Source: Ambulatory Visit | Attending: Obstetrics and Gynecology | Admitting: Obstetrics and Gynecology

## 2018-01-28 ENCOUNTER — Encounter (HOSPITAL_COMMUNITY)
Admission: AD | Disposition: A | Discharge status: Home / Self Care | Payer: Self-pay | Source: Ambulatory Visit | Attending: Obstetrics and Gynecology

## 2018-01-28 DIAGNOSIS — N809 Endometriosis, unspecified: Secondary | ICD-10-CM | POA: Insufficient documentation

## 2018-01-28 DIAGNOSIS — Z79899 Other long term (current) drug therapy: Secondary | ICD-10-CM | POA: Diagnosis not present

## 2018-01-28 DIAGNOSIS — I10 Essential (primary) hypertension: Secondary | ICD-10-CM | POA: Diagnosis not present

## 2018-01-28 DIAGNOSIS — D259 Leiomyoma of uterus, unspecified: Secondary | ICD-10-CM | POA: Insufficient documentation

## 2018-01-28 DIAGNOSIS — Z7982 Long term (current) use of aspirin: Secondary | ICD-10-CM | POA: Insufficient documentation

## 2018-01-28 DIAGNOSIS — N946 Dysmenorrhea, unspecified: Secondary | ICD-10-CM | POA: Insufficient documentation

## 2018-01-28 DIAGNOSIS — F17210 Nicotine dependence, cigarettes, uncomplicated: Secondary | ICD-10-CM | POA: Insufficient documentation

## 2018-01-28 DIAGNOSIS — K219 Gastro-esophageal reflux disease without esophagitis: Secondary | ICD-10-CM | POA: Insufficient documentation

## 2018-01-28 DIAGNOSIS — E785 Hyperlipidemia, unspecified: Secondary | ICD-10-CM | POA: Diagnosis not present

## 2018-01-28 DIAGNOSIS — N941 Unspecified dyspareunia: Secondary | ICD-10-CM | POA: Diagnosis not present

## 2018-01-28 DIAGNOSIS — Z90710 Acquired absence of both cervix and uterus: Secondary | ICD-10-CM | POA: Diagnosis present

## 2018-01-28 DIAGNOSIS — F419 Anxiety disorder, unspecified: Secondary | ICD-10-CM | POA: Diagnosis not present

## 2018-01-28 DIAGNOSIS — N736 Female pelvic peritoneal adhesions (postinfective): Secondary | ICD-10-CM

## 2018-01-28 DIAGNOSIS — R102 Pelvic and perineal pain: Secondary | ICD-10-CM | POA: Diagnosis not present

## 2018-01-28 HISTORY — PX: TOTAL LAPAROSCOPIC HYSTERECTOMY WITH SALPINGECTOMY: SHX6742

## 2018-01-28 HISTORY — PX: CYSTOSCOPY: SHX5120

## 2018-01-28 LAB — CBC
HEMATOCRIT: 37.7 % (ref 36.0–46.0)
Hemoglobin: 12.4 g/dL (ref 12.0–15.0)
MCH: 30.2 pg (ref 26.0–34.0)
MCHC: 32.9 g/dL (ref 30.0–36.0)
MCV: 92 fL (ref 78.0–100.0)
PLATELETS: 275 10*3/uL (ref 150–400)
RBC: 4.1 MIL/uL (ref 3.87–5.11)
RDW: 13.6 % (ref 11.5–15.5)
WBC: 13.9 10*3/uL — ABNORMAL HIGH (ref 4.0–10.5)

## 2018-01-28 LAB — PREGNANCY, URINE: PREG TEST UR: NEGATIVE

## 2018-01-28 SURGERY — HYSTERECTOMY, TOTAL, LAPAROSCOPIC, WITH SALPINGECTOMY
Anesthesia: General | Site: Bladder

## 2018-01-28 MED ORDER — SCOPOLAMINE 1 MG/3DAYS TD PT72
1.0000 | MEDICATED_PATCH | Freq: Once | TRANSDERMAL | Status: DC
  Administered 2018-01-28: 1.5 mg via TRANSDERMAL

## 2018-01-28 MED ORDER — SUGAMMADEX SODIUM 500 MG/5ML IV SOLN
INTRAVENOUS | Status: AC
  Filled 2018-01-28: qty 5

## 2018-01-28 MED ORDER — MENTHOL 3 MG MT LOZG
1.0000 | LOZENGE | OROMUCOSAL | Status: DC | PRN
  Administered 2018-01-28: 3 mg via ORAL
  Filled 2018-01-28: qty 9

## 2018-01-28 MED ORDER — FENTANYL CITRATE (PF) 100 MCG/2ML IJ SOLN
INTRAMUSCULAR | Status: DC | PRN
  Administered 2018-01-28 (×2): 50 ug via INTRAVENOUS
  Administered 2018-01-28: 100 ug via INTRAVENOUS
  Administered 2018-01-28: 25 ug via INTRAVENOUS
  Administered 2018-01-28: 100 ug via INTRAVENOUS
  Administered 2018-01-28: 25 ug via INTRAVENOUS
  Administered 2018-01-28: 50 ug via INTRAVENOUS
  Administered 2018-01-28: 100 ug via INTRAVENOUS

## 2018-01-28 MED ORDER — KETOROLAC TROMETHAMINE 30 MG/ML IJ SOLN
30.0000 mg | Freq: Four times a day (QID) | INTRAMUSCULAR | Status: DC
  Filled 2018-01-28: qty 1

## 2018-01-28 MED ORDER — SODIUM CHLORIDE 0.9 % IV SOLN
INTRAVENOUS | Status: DC | PRN
  Administered 2018-01-28: 60 mL

## 2018-01-28 MED ORDER — MIDAZOLAM HCL 2 MG/2ML IJ SOLN
INTRAMUSCULAR | Status: AC
  Filled 2018-01-28: qty 2

## 2018-01-28 MED ORDER — MEPERIDINE HCL 25 MG/ML IJ SOLN: 6.2500 mg | INTRAMUSCULAR | Status: DC | PRN

## 2018-01-28 MED ORDER — KETOROLAC TROMETHAMINE 30 MG/ML IJ SOLN
INTRAMUSCULAR | Status: AC
  Filled 2018-01-28: qty 1

## 2018-01-28 MED ORDER — FENTANYL CITRATE (PF) 250 MCG/5ML IJ SOLN
INTRAMUSCULAR | Status: AC
  Filled 2018-01-28: qty 5

## 2018-01-28 MED ORDER — LIDOCAINE HCL (CARDIAC) 20 MG/ML IV SOLN
INTRAVENOUS | Status: AC
  Filled 2018-01-28: qty 5

## 2018-01-28 MED ORDER — ROPIVACAINE HCL 5 MG/ML IJ SOLN
INTRAMUSCULAR | Status: AC
  Filled 2018-01-28: qty 30

## 2018-01-28 MED ORDER — ONDANSETRON HCL 4 MG/2ML IJ SOLN
INTRAMUSCULAR | Status: AC
  Filled 2018-01-28: qty 2

## 2018-01-28 MED ORDER — SODIUM CHLORIDE 0.9 % IR SOLN
Status: DC | PRN
  Administered 2018-01-28: 3000 mL

## 2018-01-28 MED ORDER — SUGAMMADEX SODIUM 200 MG/2ML IV SOLN
INTRAVENOUS | Status: AC
  Filled 2018-01-28: qty 2

## 2018-01-28 MED ORDER — BUPIVACAINE HCL (PF) 0.25 % IJ SOLN
INTRAMUSCULAR | Status: DC | PRN
  Administered 2018-01-28: 17 mL

## 2018-01-28 MED ORDER — LIDOCAINE HCL (CARDIAC) 20 MG/ML IV SOLN
INTRAVENOUS | Status: DC | PRN
  Administered 2018-01-28: 60 mg via INTRAVENOUS

## 2018-01-28 MED ORDER — HYDROMORPHONE HCL 1 MG/ML IJ SOLN
INTRAMUSCULAR | Status: AC
  Filled 2018-01-28: qty 1

## 2018-01-28 MED ORDER — BUSPIRONE HCL 5 MG PO TABS
5.0000 mg | ORAL_TABLET | Freq: Two times a day (BID) | ORAL | Status: DC
  Administered 2018-01-28 (×2): 5 mg via ORAL
  Filled 2018-01-28 (×3): qty 1

## 2018-01-28 MED ORDER — DOCUSATE SODIUM 100 MG PO CAPS
100.0000 mg | ORAL_CAPSULE | Freq: Two times a day (BID) | ORAL | Status: DC
  Administered 2018-01-28 (×2): 100 mg via ORAL
  Filled 2018-01-28 (×2): qty 1

## 2018-01-28 MED ORDER — PROPOFOL 10 MG/ML IV BOLUS
INTRAVENOUS | Status: AC
  Filled 2018-01-28: qty 20

## 2018-01-28 MED ORDER — GLYCOPYRROLATE 0.2 MG/ML IJ SOLN
INTRAMUSCULAR | Status: DC | PRN
  Administered 2018-01-28 (×2): 0.1 mg via INTRAVENOUS

## 2018-01-28 MED ORDER — SUGAMMADEX SODIUM 200 MG/2ML IV SOLN
INTRAVENOUS | Status: DC | PRN
  Administered 2018-01-28: 157.8 mg via INTRAVENOUS

## 2018-01-28 MED ORDER — SCOPOLAMINE 1 MG/3DAYS TD PT72
MEDICATED_PATCH | TRANSDERMAL | Status: AC
  Administered 2018-01-28: 1.5 mg via TRANSDERMAL
  Filled 2018-01-28: qty 1

## 2018-01-28 MED ORDER — CEFOTETAN DISODIUM-DEXTROSE 2-2.08 GM-%(50ML) IV SOLR
INTRAVENOUS | Status: AC
  Filled 2018-01-28: qty 50

## 2018-01-28 MED ORDER — STERILE WATER FOR IRRIGATION IR SOLN
Status: DC | PRN
  Administered 2018-01-28: 1000 mL via INTRAVESICAL

## 2018-01-28 MED ORDER — CEFOTETAN DISODIUM-DEXTROSE 2-2.08 GM-%(50ML) IV SOLR
2.0000 g | INTRAVENOUS | Status: AC
  Administered 2018-01-28: 2 g via INTRAVENOUS

## 2018-01-28 MED ORDER — BUPIVACAINE HCL (PF) 0.25 % IJ SOLN
INTRAMUSCULAR | Status: AC
  Filled 2018-01-28: qty 30

## 2018-01-28 MED ORDER — SODIUM CHLORIDE 0.9 % IJ SOLN
INTRAMUSCULAR | Status: AC
  Filled 2018-01-28: qty 50

## 2018-01-28 MED ORDER — KETOROLAC TROMETHAMINE 30 MG/ML IJ SOLN
30.0000 mg | Freq: Four times a day (QID) | INTRAMUSCULAR | Status: DC
  Administered 2018-01-28 – 2018-01-29 (×3): 30 mg via INTRAVENOUS
  Filled 2018-01-28 (×2): qty 1

## 2018-01-28 MED ORDER — ENOXAPARIN SODIUM 40 MG/0.4ML ~~LOC~~ SOLN
40.0000 mg | SUBCUTANEOUS | Status: DC
  Administered 2018-01-29: 40 mg via SUBCUTANEOUS
  Filled 2018-01-28: qty 0.4

## 2018-01-28 MED ORDER — KCL IN DEXTROSE-NACL 20-5-0.45 MEQ/L-%-% IV SOLN
INTRAVENOUS | Status: DC
  Administered 2018-01-28: 12:00:00 via INTRAVENOUS
  Filled 2018-01-28 (×2): qty 1000

## 2018-01-28 MED ORDER — OXYCODONE-ACETAMINOPHEN 5-325 MG PO TABS: 1.0000 | ORAL_TABLET | ORAL | Status: DC | PRN

## 2018-01-28 MED ORDER — ENOXAPARIN SODIUM 40 MG/0.4ML ~~LOC~~ SOLN
SUBCUTANEOUS | Status: AC
  Administered 2018-01-28: 40 mg via SUBCUTANEOUS
  Filled 2018-01-28: qty 0.4

## 2018-01-28 MED ORDER — KETOROLAC TROMETHAMINE 30 MG/ML IJ SOLN
INTRAMUSCULAR | Status: DC | PRN
  Administered 2018-01-28: 30 mg via INTRAVENOUS

## 2018-01-28 MED ORDER — ALUM & MAG HYDROXIDE-SIMETH 200-200-20 MG/5ML PO SUSP: 30.0000 mL | ORAL | Status: DC | PRN

## 2018-01-28 MED ORDER — ROCURONIUM BROMIDE 100 MG/10ML IV SOLN
INTRAVENOUS | Status: DC | PRN
  Administered 2018-01-28: 20 mg via INTRAVENOUS
  Administered 2018-01-28: 50 mg via INTRAVENOUS
  Administered 2018-01-28: 20 mg via INTRAVENOUS

## 2018-01-28 MED ORDER — FENTANYL CITRATE (PF) 100 MCG/2ML IJ SOLN
INTRAMUSCULAR | Status: AC
  Filled 2018-01-28: qty 2

## 2018-01-28 MED ORDER — ONDANSETRON HCL 4 MG/2ML IJ SOLN: 4.0000 mg | Freq: Four times a day (QID) | INTRAMUSCULAR | Status: DC | PRN

## 2018-01-28 MED ORDER — MIDAZOLAM HCL 2 MG/2ML IJ SOLN
INTRAMUSCULAR | Status: DC | PRN
  Administered 2018-01-28: 2 mg via INTRAVENOUS

## 2018-01-28 MED ORDER — SUMATRIPTAN 5 MG/ACT NA SOLN: 1.0000 | NASAL | Status: DC | PRN

## 2018-01-28 MED ORDER — ONDANSETRON HCL 4 MG PO TABS: 4.0000 mg | ORAL_TABLET | Freq: Four times a day (QID) | ORAL | Status: DC | PRN

## 2018-01-28 MED ORDER — OXYCODONE-ACETAMINOPHEN 5-325 MG PO TABS
2.0000 | ORAL_TABLET | ORAL | Status: DC | PRN
  Administered 2018-01-28 – 2018-01-29 (×4): 2 via ORAL
  Filled 2018-01-28 (×4): qty 2

## 2018-01-28 MED ORDER — ENOXAPARIN SODIUM 40 MG/0.4ML ~~LOC~~ SOLN
40.0000 mg | SUBCUTANEOUS | Status: AC
  Administered 2018-01-28: 40 mg via SUBCUTANEOUS
  Filled 2018-01-28: qty 0.4

## 2018-01-28 MED ORDER — DEXAMETHASONE SODIUM PHOSPHATE 10 MG/ML IJ SOLN
INTRAMUSCULAR | Status: DC | PRN
  Administered 2018-01-28: 10 mg via INTRAVENOUS

## 2018-01-28 MED ORDER — LACTATED RINGERS IV SOLN
INTRAVENOUS | Status: DC
  Administered 2018-01-28 (×2): via INTRAVENOUS
  Administered 2018-01-28: 125 mL/h via INTRAVENOUS

## 2018-01-28 MED ORDER — ALBUTEROL SULFATE HFA 108 (90 BASE) MCG/ACT IN AERS
INHALATION_SPRAY | RESPIRATORY_TRACT | Status: AC
  Filled 2018-01-28: qty 6.7

## 2018-01-28 MED ORDER — HYDROMORPHONE HCL 1 MG/ML IJ SOLN
INTRAMUSCULAR | Status: DC | PRN
  Administered 2018-01-28: 1 mg via INTRAVENOUS

## 2018-01-28 MED ORDER — METOCLOPRAMIDE HCL 5 MG/ML IJ SOLN: 10.0000 mg | Freq: Once | INTRAMUSCULAR | Status: DC | PRN

## 2018-01-28 MED ORDER — HYDROMORPHONE HCL 1 MG/ML IJ SOLN: 0.2000 mg | INTRAMUSCULAR | Status: DC | PRN

## 2018-01-28 MED ORDER — ZOLPIDEM TARTRATE 5 MG PO TABS
5.0000 mg | ORAL_TABLET | Freq: Every evening | ORAL | Status: DC | PRN
  Administered 2018-01-28: 5 mg via ORAL
  Filled 2018-01-28: qty 1

## 2018-01-28 MED ORDER — ONDANSETRON HCL 4 MG/2ML IJ SOLN
INTRAMUSCULAR | Status: DC | PRN
  Administered 2018-01-28: 4 mg via INTRAVENOUS

## 2018-01-28 MED ORDER — HYDROMORPHONE HCL 1 MG/ML IJ SOLN: 0.2500 mg | INTRAMUSCULAR | Status: DC | PRN

## 2018-01-28 MED ORDER — SODIUM CHLORIDE 0.9 % IJ SOLN
INTRAMUSCULAR | Status: DC | PRN
  Administered 2018-01-28: 10 mL

## 2018-01-28 MED ORDER — HYDROCHLOROTHIAZIDE 25 MG PO TABS: 25.0000 mg | ORAL_TABLET | Freq: Every day | ORAL | Status: DC

## 2018-01-28 MED ORDER — ALBUTEROL SULFATE HFA 108 (90 BASE) MCG/ACT IN AERS
INHALATION_SPRAY | RESPIRATORY_TRACT | Status: DC | PRN
  Administered 2018-01-28: 2 via RESPIRATORY_TRACT

## 2018-01-28 MED ORDER — DEXAMETHASONE SODIUM PHOSPHATE 10 MG/ML IJ SOLN
INTRAMUSCULAR | Status: AC
  Filled 2018-01-28: qty 1

## 2018-01-28 MED ORDER — PROPOFOL 10 MG/ML IV BOLUS
INTRAVENOUS | Status: DC | PRN
  Administered 2018-01-28: 200 mg via INTRAVENOUS

## 2018-01-28 MED ORDER — ROCURONIUM BROMIDE 100 MG/10ML IV SOLN
INTRAVENOUS | Status: AC
  Filled 2018-01-28: qty 1

## 2018-01-28 SURGICAL SUPPLY — 58 items
APPLICATOR ARISTA FLEXITIP XL (MISCELLANEOUS) ×4 IMPLANT
CABLE HIGH FREQUENCY MONO STRZ (ELECTRODE) ×4 IMPLANT
CANISTER SUCT 3000ML PPV (MISCELLANEOUS) ×4 IMPLANT
CELL SAVER LIPIGURD (MISCELLANEOUS) IMPLANT
COVER MAYO STAND STRL (DRAPES) ×4 IMPLANT
DECANTER SPIKE VIAL GLASS SM (MISCELLANEOUS) ×12 IMPLANT
DERMABOND ADVANCED (GAUZE/BANDAGES/DRESSINGS) ×2
DERMABOND ADVANCED .7 DNX12 (GAUZE/BANDAGES/DRESSINGS) ×2 IMPLANT
DURAPREP 26ML APPLICATOR (WOUND CARE) ×4 IMPLANT
EXTRT SYSTEM ALEXIS 14CM (MISCELLANEOUS)
EXTRT SYSTEM ALEXIS 17CM (MISCELLANEOUS)
GLOVE BIOGEL PI IND STRL 7.0 (GLOVE) ×8 IMPLANT
GLOVE BIOGEL PI INDICATOR 7.0 (GLOVE) ×8
GLOVE ECLIPSE 6.5 STRL STRAW (GLOVE) ×4 IMPLANT
GOWN STRL REUS W/TWL LRG LVL3 (GOWN DISPOSABLE) ×16 IMPLANT
HARMONIC RUM II 2.5CM SILVER (DISPOSABLE) ×4
HARMONIC RUM II 3.0CM SILVER (DISPOSABLE)
HARMONIC RUM II 3.5CM SILVER (DISPOSABLE)
HARMONIC RUM II 4.0CM SILVER (DISPOSABLE)
HEMOSTAT ARISTA ABSORB 3G PWDR (MISCELLANEOUS) IMPLANT
LIGASURE VESSEL 5MM BLUNT TIP (ELECTROSURGICAL) ×4 IMPLANT
NEEDLE INSUFFLATION 120MM (ENDOMECHANICALS) ×4 IMPLANT
PACK LAPAROSCOPY BASIN (CUSTOM PROCEDURE TRAY) ×4 IMPLANT
PACK TRENDGUARD 450 HYBRID PRO (MISCELLANEOUS) ×2 IMPLANT
PACK TRENDGUARD 600 HYBRD PROC (MISCELLANEOUS) IMPLANT
POUCH LAPAROSCOPIC INSTRUMENT (MISCELLANEOUS) ×4 IMPLANT
PROTECTOR NERVE ULNAR (MISCELLANEOUS) ×8 IMPLANT
SCALPEL HRMNC RUM II 2.5 SILVR (DISPOSABLE) ×2 IMPLANT
SCALPEL HRMNC RUM II 3.0 SILVR (DISPOSABLE) IMPLANT
SCALPEL HRMNC RUM II 3.5 SILVR (DISPOSABLE) IMPLANT
SCALPEL HRMNC RUM II 4.0 SILVR (DISPOSABLE) IMPLANT
SCISSORS LAP 5X35 DISP (ENDOMECHANICALS) IMPLANT
SET CYSTO W/LG BORE CLAMP LF (SET/KITS/TRAYS/PACK) ×4 IMPLANT
SET IRRIG TUBING LAPAROSCOPIC (IRRIGATION / IRRIGATOR) ×4 IMPLANT
SET TRI-LUMEN FLTR TB AIRSEAL (TUBING) ×4 IMPLANT
SHEARS HARMONIC ACE PLUS 36CM (ENDOMECHANICALS) ×4 IMPLANT
SUT VIC AB 0 CT1 27 (SUTURE) ×2
SUT VIC AB 0 CT1 27XBRD ANBCTR (SUTURE) ×2 IMPLANT
SUT VICRYL 0 UR6 27IN ABS (SUTURE) ×4 IMPLANT
SUT VICRYL 4-0 PS2 18IN ABS (SUTURE) ×4 IMPLANT
SUT VLOC 180 0 9IN  GS21 (SUTURE) ×4
SUT VLOC 180 0 9IN GS21 (SUTURE) ×4 IMPLANT
SYR 50ML LL SCALE MARK (SYRINGE) ×8 IMPLANT
SYSTEM CONTND EXTRCTN KII BLLN (MISCELLANEOUS) IMPLANT
TIP RUMI ORANGE 6.7MMX12CM (TIP) IMPLANT
TIP UTERINE 5.1X6CM LAV DISP (MISCELLANEOUS) IMPLANT
TIP UTERINE 6.7X10CM GRN DISP (MISCELLANEOUS) IMPLANT
TIP UTERINE 6.7X6CM WHT DISP (MISCELLANEOUS) IMPLANT
TIP UTERINE 6.7X8CM BLUE DISP (MISCELLANEOUS) ×4 IMPLANT
TOWEL OR 17X24 6PK STRL BLUE (TOWEL DISPOSABLE) ×8 IMPLANT
TRAY FOLEY CATH SILVER 14FR (SET/KITS/TRAYS/PACK) ×4 IMPLANT
TRENDGUARD 450 HYBRID PRO PACK (MISCELLANEOUS) ×4
TRENDGUARD 600 HYBRID PROC PK (MISCELLANEOUS)
TROCAR ADV FIXATION 5X100MM (TROCAR) ×4 IMPLANT
TROCAR PORT AIRSEAL 5X120 (TROCAR) ×4 IMPLANT
TROCAR XCEL NON BLADE 8MM B8LT (ENDOMECHANICALS) ×4 IMPLANT
TROCAR XCEL NON-BLD 5MMX100MML (ENDOMECHANICALS) ×4 IMPLANT
WARMER LAPAROSCOPE (MISCELLANEOUS) ×4 IMPLANT

## 2018-01-28 NOTE — Op Note (Signed)
Preoperative Diagnosis: chronic pelvic pain, dysmenorrhea, dyspareunia, fibroid uterus  Postoperative Diagnosis: chronic pelvic pain, dysmenorrhea, dyspareunia, adhesions, fibroid uterus  Procedure:  Total Laparoscopic Hysterectomy with bilateral salpingectomy, lysis of adhesions and cystoscopy  Surgeon: Dr Sumner Boast  Assistant: Dr Edwinna Areola  Anesthesia: General  EBL: 100 cc  Fluids: 1600 cc LR  Urine output: 270 cc  Complications: none  Indications for surgery: The patient is a 37 year old female, who presented with chronic pelvic pain, dysmenorrhea and dyspareunia. Work up included a pelvic ultrasound, but was otherwise normal. She was treated with depo-provera but had spotting and continued pain. She is not a candidate for OCP's and declined a trial of a mirena IUD. She has a h/o infertility and is not desiring future pregnancies.  The patient is aware of the risks and complications involved with the surgery and consent was obtained prior to the procedure.  Findings: EUA: normal sized uterus, no adnexal masses. Laparoscopy: uterus with an approximately 4 cm anterior subserosal myoma. Both tubes with some scarring to the ovaries. There were adhesions of the right ovary to the pelvic side wall  Procedure: The patient was taken to the operating room with an IV in placed, preoperative antibiotics had been administered. She was placed in the dorsal lithotomy position. General anesthesia was administered. She was prepped and draped in the usual sterile fashion for an abdominal, vaginal surgery. A rumi uterine manipulator was placed, using a # 2.5 cup and a 8 cm extender. A foley catheter was placed.    The umbilicus was everted, injected with 0.25% marcaine and incised with a # 11 blade. 2 towel clips were used to elevated the umbilicus and a veress needle was placed into the abdominal cavity. The abdominal cavity was insufflated with CO2, with normal intraabdominal pressures. After  adequate pneumo-insufflation the veress needle was removed and the 5 mm laparoscope was placed into the abdominal cavity using the opti-view trocar. The patient was placed in trendelenburg and the abdominal pelvic cavity was inspected. 3 more trocars were placed: 1 in each lower quadrant approximately 3 cm medial to and superior to the anterior superior iliac spine and one in the midline approximately 6 cm above the pubic symphysis in the midline. These areas were injected with 0.25% marcaine, incised with a #11 blade and all trocars were inserted with direct visualization with the laparoscope. A # 5 airseal trocar was placed in the RLQ, a 5 mm trocar in the LLQ and a #8 airseal in the midline. The abdominal pelvic cavity was again inspected. A mixture of 30 cc of Robivacaine and 30 cc of NS was place in the pelvic cavity.   The left tube was elevated from the pelvic sidewall, cauterized and cut with the ligasure device. The mesosalpinx was cauterized and cut with the ligasure device. The tube was separated from the uterus using the ligasure device and removed through the midline trocar. The left uteroovarian ligament was cauterized and cut with the ligasure devise. The left round ligament was cauterized and cut with the ligasure device and the anterior and posterior leafs of the broad ligament were taken down with the harmonic scalpel. The same procedure was repeated on the right. The harmonic scalpel was then used to take down the bladder flap and skeltonize the vessels. The tissue plane over the bladder was slightly difficult to develop given the anterior fibroid and fragile tissue. The bladder was filled during the process to aid in visualization. The left uterine vessels were  then clamped, cauterized and ligated with the ligasure device. The same procedure was repeated on the right. The anterior fibroid made visualization more difficult and a 30 degree scope was used for part of the procedure. Her tissue was  somewhat fragile, she had some bleeding from the left side of the uterus that was treated with the ligasure device.   Using the rumi manipulator the uterus was pushed up in the pelvic cavity and the harmonic scalpel was used to separate the cervix from the vagina using the harmonic energy. The uterus was partially removed vaginally at this timed. It was not completely removed in order to maintain pneumoperitoneum. The vaginal cuff was then closed with a 0 V-lock suture. Hemostasis was excellent. The abdominal pelvic cavity was irrigated and suctioned dry. Pressure was released and hemostasis remained excellent.   The abdominal cavity was desufflated and the trocars were removed. The skin was closed with subcuticular stiches of 4-0 vicryl and dermabond was placed over the incisions.  The uterus was removed from the vagina and the vaginal cuff was inspected and noted to be hemostatic. The foley catheter was removed and cystoscopy was performed using a 70 degree scope. Both ureters expelled urine, no bladder abnormalities were noted. The bladder was allowed to drain and the cystoscope was removed.   The patient's abdomen and perineum were cleansed and she was taken out of the dorsal lithotomy position. Upon awakening she was extubated and taken to the recovery room in stable condition. The sponge and instrument counts were correct.

## 2018-01-28 NOTE — Anesthesia Postprocedure Evaluation (Signed)
Anesthesia Post Note  Patient: Chayah Mckee  Procedure(s) Performed: TOTAL LAPAROSCOPIC HYSTERECTOMY WITH SALPINGECTOMY (Bilateral Abdomen) CYSTOSCOPY (N/A Bladder)     Patient location during evaluation: [facility] Anesthesia Type: General Level of consciousness: awake, awake and alert, oriented and patient cooperative Pain management: satisfactory to patient Vital Signs Assessment: post-procedure vital signs reviewed and stable Respiratory status: spontaneous breathing, nonlabored ventilation and respiratory function stable Cardiovascular status: stable Postop Assessment: no apparent nausea or vomiting Anesthetic complications: no    Last Vitals:  Vitals:   01/28/18 1210 01/28/18 1400  BP: (!) 148/100 (!) 146/94  Pulse: 77 80  Resp: 15 16  Temp: 36.6 C 36.9 C  SpO2: 96% 95%    Last Pain:  Vitals:   01/28/18 1400  TempSrc: Oral  PainSc:    Pain Goal: Patients Stated Pain Goal: 3 (01/28/18 1130)               Cielle Aguila L

## 2018-01-28 NOTE — Transfer of Care (Signed)
Immediate Anesthesia Transfer of Care Note  Patient: Claudia Ball  Procedure(s) Performed: TOTAL LAPAROSCOPIC HYSTERECTOMY WITH SALPINGECTOMY (Bilateral Abdomen) CYSTOSCOPY (N/A Bladder)  Patient Location: PACU  Anesthesia Type:General  Level of Consciousness: awake, alert  and oriented  Airway & Oxygen Therapy: Patient Spontanous Breathing and Patient connected to nasal cannula oxygen  Post-op Assessment: Report given to RN and Post -op Vital signs reviewed and stable  Post vital signs: Reviewed and stable  Last Vitals:  Vitals:   01/28/18 0619  BP: (!) 128/92  Pulse: 87  Resp: 16  Temp: 36.8 C  SpO2: 100%    Last Pain:  Vitals:   01/28/18 0619  TempSrc: Oral  PainSc: 2       Patients Stated Pain Goal: 3 (29/56/21 3086)  Complications: No apparent anesthesia complications

## 2018-01-28 NOTE — Anesthesia Postprocedure Evaluation (Signed)
Anesthesia Post Note  Patient: Claudia Ball  Procedure(s) Performed: TOTAL LAPAROSCOPIC HYSTERECTOMY WITH SALPINGECTOMY (Bilateral Abdomen) CYSTOSCOPY (N/A Bladder)     Patient location during evaluation: PACU Anesthesia Type: General Level of consciousness: awake and alert Pain management: pain level controlled Vital Signs Assessment: post-procedure vital signs reviewed and stable Respiratory status: spontaneous breathing, nonlabored ventilation, respiratory function stable and patient connected to nasal cannula oxygen Cardiovascular status: blood pressure returned to baseline and stable Postop Assessment: no apparent nausea or vomiting Anesthetic complications: no    Last Vitals:  Vitals:   01/28/18 1130 01/28/18 1135  BP: (!) 160/110 (!) 152/103  Pulse: 90 97  Resp: 16 10  Temp:  36.6 C  SpO2: 100% 100%    Last Pain:  Vitals:   01/28/18 1130  TempSrc:   PainSc: 0-No pain   Pain Goal: Patients Stated Pain Goal: 3 (01/28/18 1130)               Christapher Gillian A.

## 2018-01-28 NOTE — Anesthesia Procedure Notes (Signed)
Procedure Name: Intubation Date/Time: 01/28/2018 7:28 AM Performed by: Genevie Ann, CRNA Pre-anesthesia Checklist: Patient identified, Emergency Drugs available, Suction available, Patient being monitored and Timeout performed Patient Re-evaluated:Patient Re-evaluated prior to induction Oxygen Delivery Method: Circle system utilized Preoxygenation: Pre-oxygenation with 100% oxygen Induction Type: IV induction Ventilation: Mask ventilation without difficulty Laryngoscope Size: Mac and 3 Grade View: Grade I Tube type: Oral Tube size: 7.0 mm Number of attempts: 1 Airway Equipment and Method: Stylet Placement Confirmation: ETT inserted through vocal cords under direct vision,  positive ETCO2 and breath sounds checked- equal and bilateral Secured at: 21 cm Tube secured with: Tape Dental Injury: Teeth and Oropharynx as per pre-operative assessment

## 2018-01-28 NOTE — Interval H&P Note (Signed)
History and Physical Interval Note:  01/28/2018 7:11 AM  Claudia Ball  has presented today for surgery, with the diagnosis of pelvic pain, hx of endometriosis  The various methods of treatment have been discussed with the patient and family. After consideration of risks, benefits and other options for treatment, the patient has consented to  Procedure(s) with comments: TOTAL LAPAROSCOPIC HYSTERECTOMY WITH SALPINGECTOMY (Bilateral) CYSTOSCOPY (N/A) - possible cysto as a surgical intervention .  The patient's history has been reviewed, patient examined, no change in status, stable for surgery.  I have reviewed the patient's chart and labs.  Questions were answered to the patient's satisfaction.     Salvadore Dom

## 2018-01-28 NOTE — Progress Notes (Signed)
Day of Surgery Procedure(s) (LRB): TOTAL LAPAROSCOPIC HYSTERECTOMY WITH SALPINGECTOMY (Bilateral) CYSTOSCOPY (N/A)  Subjective: Patient reports that she is doing okay. She is tolerating liquids, voiding, hasn't eaten yet. No gas, walking some    Objective: I have reviewed patient's vital signs, intake and output and labs.  Today's Vitals   01/28/18 1451 01/28/18 1600 01/28/18 1605 01/28/18 1712  BP:  (!) 148/91    Pulse:  70    Resp:  16    Temp:  98.4 F (36.9 C)    TempSrc:  Oral    SpO2:  96%    Weight:      Height:      PainSc: Asleep  8  7    No intake/output data recorded. Total I/O In: 2558.3 [I.V.:2558.3] Out: 650 [Urine:550; Blood:100]   CBC Latest Ref Rng & Units 01/28/2018 01/18/2018 01/11/2017  WBC 4.0 - 10.5 K/uL 13.9(H) 11.6(H) 8.9  Hemoglobin 12.0 - 15.0 g/dL 12.4 13.6 12.9  Hematocrit 36.0 - 46.0 % 37.7 40.5 39.3  Platelets 150 - 400 K/uL 275 284 257     General: alert, cooperative and no distress Resp: clear to auscultation bilaterally Cardio: S1, S2 normal GI: soft, appropriately tender, mildly distended, +BS. Incisions: clean, dry and intact without erythema.  Extremities: extremities normal, atraumatic, no cyanosis or edema  Assessment: s/p Procedure(s): TOTAL LAPAROSCOPIC HYSTERECTOMY WITH SALPINGECTOMY (Bilateral) CYSTOSCOPY (N/A): stable and progressing well  Plan: Advance diet Encourage ambulation  Anticipate d/c in the morning  LOS: 0 days    Salvadore Dom 01/28/2018, 6:14 PM

## 2018-01-28 NOTE — Addendum Note (Signed)
Addendum  created 01/28/18 1511 by Raenette Rover, CRNA   Sign clinical note

## 2018-01-29 ENCOUNTER — Encounter (HOSPITAL_COMMUNITY): Payer: Self-pay | Admitting: Obstetrics and Gynecology

## 2018-01-29 ENCOUNTER — Telehealth: Payer: Self-pay | Admitting: *Deleted

## 2018-01-29 DIAGNOSIS — D259 Leiomyoma of uterus, unspecified: Secondary | ICD-10-CM | POA: Diagnosis not present

## 2018-01-29 MED ORDER — DOCUSATE SODIUM 100 MG PO CAPS: 100.0000 mg | ORAL_CAPSULE | Freq: Two times a day (BID) | ORAL | 0 refills | Status: AC

## 2018-01-29 MED ORDER — IBUPROFEN 800 MG PO TABS
800.0000 mg | ORAL_TABLET | Freq: Three times a day (TID) | ORAL | 1 refills | Status: AC | PRN
Start: 2018-01-29 — End: ?

## 2018-01-29 MED ORDER — OXYCODONE-ACETAMINOPHEN 5-325 MG PO TABS: 1.0000 | ORAL_TABLET | ORAL | 0 refills | Status: DC | PRN

## 2018-01-29 NOTE — Progress Notes (Signed)
Discharge teaching complete with pt. Pt understood all information and did not have any questions. Pt discharged home to family. 

## 2018-01-29 NOTE — Telephone Encounter (Signed)
Pharmacist, Prameela, calling from [place]. Rx received for percocet 1-2 tabs q4hr PRN, was advised to comply with [facility] policy must dispense as 1 tab q4 PRN or 2 tab PRN.  Advised to dispense Percocet as 1 tab q4 PRN #30.  Routing to provider for final review. Will close encounter.

## 2018-01-29 NOTE — Progress Notes (Signed)
1 Day Post-Op Procedure(s) (LRB): TOTAL LAPAROSCOPIC HYSTERECTOMY WITH SALPINGECTOMY (Bilateral) CYSTOSCOPY (N/A)  Subjective: Patient reports that she is doing well, ambulating, tolerating po, voiding, pain is well controlled.   Objective: I have reviewed patient's vital signs and intake and output.  General: alert, cooperative and no distress Resp: clear to auscultation bilaterally Cardio: S1, S2 normal GI: soft, not tender, +BS, mildly distended. Incisions: clean, dry and intact without erythema. There is some bruising around the SP incision.  Extremities: extremities normal, atraumatic, no cyanosis or edema  Assessment: s/p Procedure(s): TOTAL LAPAROSCOPIC HYSTERECTOMY WITH SALPINGECTOMY (Bilateral) CYSTOSCOPY (N/A): stable, progressing well and tolerating diet  Plan: Discharge home  LOS: 0 days    Salvadore Dom 01/29/2018, 7:14 AM

## 2018-01-30 NOTE — Telephone Encounter (Signed)
Spoke with patient, advised as seen below per Dr. Talbert Nan. Patient states pain has been managed well with 1 percocet and 1 motrin twice daily.   Patient states she has a bruise near incision on left lower abdomen, asking if normal? Describes as soft, not swollen or warm, no pain or bleeding from incision.   Advised bruising is common after surgery at surgical site. Continue to monitor, return call if area becomes hard, red/swollen, warm to touch or bleeding from incision. Advised Dr. Talbert Nan will review, will return call with any additional recommendations. Patient verbalizes understanding and is agreeable.   Routing to provider for final review. Patient is agreeable to disposition. Will close encounter.

## 2018-01-30 NOTE — Telephone Encounter (Signed)
Please make sure the patient knows that she can take up to 2 tablets every 4 hours if needed. Ideally she should take the percocet as little as needed.

## 2018-02-04 ENCOUNTER — Telehealth: Payer: Self-pay | Admitting: Obstetrics and Gynecology

## 2018-02-04 ENCOUNTER — Ambulatory Visit: Payer: 59 | Admitting: Obstetrics and Gynecology

## 2018-02-04 NOTE — Telephone Encounter (Signed)
Patient arrived to her 12:45pm PP appointment. Patient did not check her messages that her appointment had been rescheduled to 2:00pm. Patient could not come back at 2:00pm and rescheduled to 02/06/18 at 9:00am.

## 2018-02-06 ENCOUNTER — Encounter: Payer: Self-pay | Admitting: Obstetrics and Gynecology

## 2018-02-06 ENCOUNTER — Other Ambulatory Visit: Payer: Self-pay

## 2018-02-06 ENCOUNTER — Ambulatory Visit (INDEPENDENT_AMBULATORY_CARE_PROVIDER_SITE_OTHER): Payer: 59 | Admitting: Obstetrics and Gynecology

## 2018-02-06 VITALS — BP 156/106 | HR 72 | Resp 16 | Wt 179.0 lb

## 2018-02-06 DIAGNOSIS — Z90710 Acquired absence of both cervix and uterus: Secondary | ICD-10-CM

## 2018-02-06 NOTE — Progress Notes (Signed)
GYNECOLOGY  VISIT   HPI: 37 y.o.   Married  [place] American  female   [phone] with No LMP recorded (lmp unknown). Patient has had a hysterectomy.   here for follow up. Patient is 8 days s/p TLH. She is doing well, pain is fine, tolerable. She is taking the ibuprofen regularly, taking the oxycodone once every 6 hours as needed. Voiding okay. She had a BM on Sunday after she did an enema. She is doing the colace every day. The BM she had was fine.    GYNECOLOGIC HISTORY: No LMP recorded (lmp unknown). Patient has had a hysterectomy. Contraception:hysterectomy  Menopausal hormone therapy: none         OB History    Gravida Para Term Preterm AB Living   4 1 1   3 1    SAB TAB Ectopic Multiple Live Births   2   1   1          Patient Active Problem List   Diagnosis Date Noted  . Status post laparoscopic hysterectomy 01/28/2018    Past Medical History:  Diagnosis Date  . Anxiety   . Bronchitis    patient states get a "touch of bronchitis every year when the season changes"   . Ectopic pregnancy   . Endometriosis   . Former smoker    quit 12/2016  . GERD (gastroesophageal reflux disease)    occasional - diet  controlled  . Headache   . Hyperlipidemia    no med, diet controlled  . Hypertension   . Miscarriage   . Seasonal allergies   . STD (sexually transmitted disease)    HSV II   . SVD (spontaneous vaginal delivery)    x 1    Past Surgical History:  Procedure Laterality Date  . CYSTOSCOPY N/A 01/28/2018   Procedure: CYSTOSCOPY;  Surgeon: Salvadore Dom, MD;  Location: [facility];  Service: Gynecology;  Laterality: N/A;  . ECTOPIC PREGNANCY SURGERY  2015  . GYNECOLOGIC CRYOSURGERY    . TOTAL LAPAROSCOPIC HYSTERECTOMY WITH SALPINGECTOMY Bilateral 01/28/2018   Procedure: TOTAL LAPAROSCOPIC HYSTERECTOMY WITH SALPINGECTOMY;  Surgeon: Salvadore Dom, MD;  Location: [facility];  Service: Gynecology;  Laterality: Bilateral;    Current Outpatient Medications  Medication  Sig Dispense Refill  . acyclovir (ZOVIRAX) 400 MG tablet 1 tab po BID (Patient taking differently: Take 400 mg by mouth daily. ) 180 tablet 3  . amphetamine-dextroamphetamine (ADDERALL XR) 10 MG 24 hr capsule Take 10 mg by mouth daily.    . busPIRone (BUSPAR) 5 MG tablet Take 5 mg by mouth 2 (two) times daily.  3  . docusate sodium (COLACE) 100 MG capsule Take 1 capsule (100 mg total) by mouth 2 (two) times daily. 60 capsule 0  . hydrochlorothiazide (HYDRODIURIL) 25 MG tablet Take 25 mg by mouth daily.  4  . ibuprofen (ADVIL,MOTRIN) 800 MG tablet Take 1 tablet (800 mg total) by mouth every 8 (eight) hours as needed. 30 tablet 1  . Multiple Vitamins-Minerals (MULTIVITAMIN WITH MINERALS) tablet Take 1 tablet by mouth daily.    Marland Kitchen oxyCODONE-acetaminophen (PERCOCET/ROXICET) 5-325 MG tablet Take 1-2 tablets by mouth every 4 (four) hours as needed for moderate pain ((when tolerating fluids)). 30 tablet 0  . SUMAtriptan (IMITREX) 5 MG/ACT nasal spray Place 1 spray into the nose every 2 (two) hours as needed for migraine.    Marland Kitchen zolpidem (AMBIEN) 10 MG tablet TAKE 1 TABLET BY MOUTH NIGHTLY AT BEDTIME AS NEEDED SLEEP  0  No current facility-administered medications for this visit.      ALLERGIES: Patient has no known allergies.  Family History  Problem Relation Age of Onset  . Hypertension Mother   . Diabetes Mother   . Hypertension Father   . Diabetes Maternal Grandmother   . Hyperlipidemia Maternal Grandmother   . Heart failure Maternal Grandfather     Social History   Socioeconomic History  . Marital status: Married    Spouse name: Not on file  . Number of children: Not on file  . Years of education: Not on file  . Highest education level: Not on file  Social Needs  . Financial resource strain: Not on file  . Food insecurity - worry: Not on file  . Food insecurity - inability: Not on file  . Transportation needs - medical: Not on file  . Transportation needs - non-medical: Not on file   Occupational History  . Not on file  Tobacco Use  . Smoking status: Former Smoker    Packs/day: 0.25    Years: 17.00    Pack years: 4.25    Types: Cigarettes    Last attempt to quit: 12/2016    Years since quitting: 1.1  . Smokeless tobacco: Never Used  Substance and Sexual Activity  . Alcohol use: Yes    Alcohol/week: 2.4 - 4.2 oz    Types: 4 - 7 Glasses of wine per week  . Drug use: No  . Sexual activity: Yes    Partners: Male    Birth control/protection: Injection    Comment: Depo Provera   Other Topics Concern  . Not on file  Social History Narrative  . Not on file    Review of Systems  Constitutional: Negative.   HENT: Negative.   Eyes: Negative.   Respiratory: Negative.   Cardiovascular: Negative.   Gastrointestinal: Negative.   Genitourinary: Negative.   Musculoskeletal: Negative.   Skin: Negative.   Neurological: Negative.   Endo/Heme/Allergies: Negative.   Psychiatric/Behavioral: Negative.     PHYSICAL EXAMINATION:    BP (!) 156/106 (BP Location: Right Arm, Patient Position: Sitting, Cuff Size: Normal) Comment: Patient has not taken BP med in over a week  Pulse 72   Resp 16   Wt 179 lb (81.2 kg)   LMP  (LMP Unknown) Comment: Depo Provera  BMI 36.15 kg/m     General appearance: alert, cooperative and appears stated age Abdomen: soft, non-tender; non distended, no masses,  no organomegaly. Incisions are healing well, resolving ecchymosis around her lower midline incision.    ASSESSMENT 1 week s/p TLH, doing well    PLAN Routine f/u in 3 weeks Can try miralax as needed   An After Visit Summary was printed and given to the patient.

## 2018-02-21 ENCOUNTER — Other Ambulatory Visit: Payer: Self-pay

## 2018-02-21 ENCOUNTER — Encounter: Payer: Self-pay | Admitting: Obstetrics and Gynecology

## 2018-02-21 ENCOUNTER — Ambulatory Visit (INDEPENDENT_AMBULATORY_CARE_PROVIDER_SITE_OTHER): Payer: 59 | Admitting: Obstetrics and Gynecology

## 2018-02-21 VITALS — BP 122/80 | HR 92 | Resp 14 | Wt 170.0 lb

## 2018-02-21 DIAGNOSIS — Z90710 Acquired absence of both cervix and uterus: Secondary | ICD-10-CM | POA: Diagnosis not present

## 2018-02-21 DIAGNOSIS — B373 Candidiasis of vulva and vagina: Secondary | ICD-10-CM | POA: Diagnosis not present

## 2018-02-21 DIAGNOSIS — B3731 Acute candidiasis of vulva and vagina: Secondary | ICD-10-CM

## 2018-02-21 DIAGNOSIS — K602 Anal fissure, unspecified: Secondary | ICD-10-CM | POA: Diagnosis not present

## 2018-02-21 MED ORDER — FLUCONAZOLE 150 MG PO TABS: 150.0000 mg | ORAL_TABLET | Freq: Once | ORAL | 0 refills | Status: AC

## 2018-02-21 NOTE — Patient Instructions (Signed)
Anal Fissure, Adult An anal fissure is a small tear or crack in the skin around the anus. Bleeding from a fissure usually stops on its own within a few minutes. However, bleeding will often occur again with each bowel movement until the crack heals. What are the causes? This condition may be caused by:  Passing large, hard stool (feces).  Frequent diarrhea.  Constipation.  Inflammatory bowel disease (Crohn disease or ulcerative colitis).  Infections.  Anal sex.  What are the signs or symptoms? Symptoms of this condition include:  Bleeding from the rectum.  Small amounts of blood seen on your stool, on toilet paper, or in the toilet after a bowel movement.  Painful bowel movements.  Itching or irritation around the anus.  How is this diagnosed? A health care provider may diagnose this condition by closely examining the anal area. An anal fissure can usually be seen with careful inspection. In some cases, a rectal exam may be performed, or a short tube (anoscope) may be used to examine the anal canal. How is this treated? Treatment for this condition may include:  Taking steps to avoid constipation. This may include making changes to your diet, such as increasing your intake of fiber or fluid.  Taking fiber supplements. These supplements can soften your stool to help make bowel movements easier. Your health care provider may also prescribe a stool softener if your stool is often hard.  Taking sitz baths. This may help to heal the tear.  Using medicated creams or ointments. These may be prescribed to lessen discomfort.  Follow these instructions at home: Eating and drinking  Avoid foods that may be constipating, such as bananas and dairy products.  Drink enough fluid to keep your urine clear or pale yellow.  Maintain a diet that is high in fruits, whole grains, and vegetables. General instructions  Keep the anal area as clean and dry as possible.  Take sitz baths as  told by your health care provider. Do not use soap in the sitz baths.  Take over-the-counter and prescription medicines only as told by your health care provider.  Use creams or ointments only as told by your health care provider.  Keep all follow-up visits as told by your health care provider. This is important. Contact a health care provider if:  You have more bleeding.  You have a fever.  You have diarrhea that is mixed with blood.  You continue to have pain.  Your problem is getting worse rather than better. This information is not intended to replace advice given to you by your health care provider. Make sure you discuss any questions you have with your health care provider. Document Released: 11/20/2005 Document Revised: 03/29/2016 Document Reviewed: 02/15/2015 Elsevier Interactive Patient Education  2018 [employer]. Vaginal Yeast infection, Adult Vaginal yeast infection is a condition that causes soreness, swelling, and redness (inflammation) of the vagina. It also causes vaginal discharge. This is a common condition. Some women get this infection frequently. What are the causes? This condition is caused by a change in the normal balance of the yeast (candida) and bacteria that live in the vagina. This change causes an overgrowth of yeast, which causes the inflammation. What increases the risk? This condition is more likely to develop in:  Women who take antibiotic medicines.  Women who have diabetes.  Women who take birth control pills.  Women who are pregnant.  Women who douche often.  Women who have a weak defense (immune) system.  Women  who have been taking steroid medicines for a long time.  Women who frequently wear tight clothing.  What are the signs or symptoms? Symptoms of this condition include:  White, thick vaginal discharge.  Swelling, itching, redness, and irritation of the vagina. The lips of the vagina (vulva) may be affected as well.  Pain  or a burning feeling while urinating.  Pain during sex.  How is this diagnosed? This condition is diagnosed with a medical history and physical exam. This will include a pelvic exam. Your health care provider will examine a sample of your vaginal discharge under a microscope. Your health care provider may send this sample for testing to confirm the diagnosis. How is this treated? This condition is treated with medicine. Medicines may be over-the-counter or prescription. You may be told to use one or more of the following:  Medicine that is taken orally.  Medicine that is applied as a cream.  Medicine that is inserted directly into the vagina (suppository).  Follow these instructions at home:  Take or apply over-the-counter and prescription medicines only as told by your health care provider.  Do not have sex until your health care provider has approved. Tell your sex partner that you have a yeast infection. That person should go to his or her health care provider if he or she develops symptoms.  Do not wear tight clothes, such as pantyhose or tight pants.  Avoid using tampons until your health care provider approves.  Eat more yogurt. This may help to keep your yeast infection from returning.  Try taking a sitz bath to help with discomfort. This is a warm water bath that is taken while you are sitting down. The water should only come up to your hips and should cover your buttocks. Do this 3-4 times per day or as told by your health care provider.  Do not douche.  Wear breathable, cotton underwear.  If you have diabetes, keep your blood sugar levels under control. Contact a health care provider if:  You have a fever.  Your symptoms go away and then return.  Your symptoms do not get better with treatment.  Your symptoms get worse.  You have new symptoms.  You develop blisters in or around your vagina.  You have blood coming from your vagina and it is not your menstrual  period.  You develop pain in your abdomen. This information is not intended to replace advice given to you by your health care provider. Make sure you discuss any questions you have with your health care provider. Document Released: 08/30/2005 Document Revised: 05/03/2016 Document Reviewed: 05/24/2015 Elsevier Interactive Patient Education  2018 [employer].

## 2018-02-21 NOTE — Progress Notes (Signed)
GYNECOLOGY  VISIT   HPI: 37 y.o.   Married  [place] American  female   [phone] with No LMP recorded (lmp unknown). Patient has had a hysterectomy.   here for follow up. Patient is 4 weeks s/p TLH   She is feeling well since her surgery. No pain, no vaginal bleeding. Voiding fine. She thinks she has a yeast infection, some itching and odor. Slight increase in vaginal d/c. She has had some constipation after surgery and bleeding with BM. No longer having constipation. But she is still having bleeding with BM. No pain with BM  GYNECOLOGIC HISTORY: No LMP recorded (lmp unknown). Patient has had a hysterectomy. Contraception:hyterectomy  Menopausal hormone therapy: none         OB History    Gravida  4   Para  1   Term  1   Preterm      AB  3   Living  1     SAB  2   TAB      Ectopic  1   Multiple      Live Births  1              Patient Active Problem List   Diagnosis Date Noted  . Status post laparoscopic hysterectomy 01/28/2018    Past Medical History:  Diagnosis Date  . Anxiety   . Bronchitis    patient states get a "touch of bronchitis every year when the season changes"   . Ectopic pregnancy   . Endometriosis   . Former smoker    quit 12/2016  . GERD (gastroesophageal reflux disease)    occasional - diet  controlled  . Headache   . Hyperlipidemia    no med, diet controlled  . Hypertension   . Miscarriage   . Seasonal allergies   . STD (sexually transmitted disease)    HSV II   . SVD (spontaneous vaginal delivery)    x 1    Past Surgical History:  Procedure Laterality Date  . ABDOMINAL HYSTERECTOMY    . CYSTOSCOPY N/A 01/28/2018   Procedure: CYSTOSCOPY;  Surgeon: Salvadore Dom, MD;  Location: [facility];  Service: Gynecology;  Laterality: N/A;  . ECTOPIC PREGNANCY SURGERY  2015  . GYNECOLOGIC CRYOSURGERY    . TOTAL LAPAROSCOPIC HYSTERECTOMY WITH SALPINGECTOMY Bilateral 01/28/2018   Procedure: TOTAL LAPAROSCOPIC HYSTERECTOMY WITH  SALPINGECTOMY;  Surgeon: Salvadore Dom, MD;  Location: [facility];  Service: Gynecology;  Laterality: Bilateral;    Current Outpatient Medications  Medication Sig Dispense Refill  . acyclovir (ZOVIRAX) 400 MG tablet 1 tab po BID (Patient taking differently: Take 400 mg by mouth daily. ) 180 tablet 3  . amphetamine-dextroamphetamine (ADDERALL XR) 10 MG 24 hr capsule Take 10 mg by mouth daily.    . busPIRone (BUSPAR) 5 MG tablet Take 5 mg by mouth 2 (two) times daily.  3  . docusate sodium (COLACE) 100 MG capsule Take 1 capsule (100 mg total) by mouth 2 (two) times daily. 60 capsule 0  . hydrochlorothiazide (HYDRODIURIL) 25 MG tablet Take 25 mg by mouth daily.  4  . ibuprofen (ADVIL,MOTRIN) 800 MG tablet Take 1 tablet (800 mg total) by mouth every 8 (eight) hours as needed. 30 tablet 1  . Multiple Vitamins-Minerals (MULTIVITAMIN WITH MINERALS) tablet Take 1 tablet by mouth daily.    . SUMAtriptan (IMITREX) 5 MG/ACT nasal spray Place 1 spray into the nose every 2 (two) hours as needed for migraine.    Marland Kitchen zolpidem (  AMBIEN) 10 MG tablet TAKE 1 TABLET BY MOUTH NIGHTLY AT BEDTIME AS NEEDED SLEEP  0   No current facility-administered medications for this visit.      ALLERGIES: Patient has no known allergies.  Family History  Problem Relation Age of Onset  . Hypertension Mother   . Diabetes Mother   . Hypertension Father   . Diabetes Maternal Grandmother   . Hyperlipidemia Maternal Grandmother   . Heart failure Maternal Grandfather     Social History   Socioeconomic History  . Marital status: Married    Spouse name: Not on file  . Number of children: Not on file  . Years of education: Not on file  . Highest education level: Not on file  Occupational History  . Not on file  Social Needs  . Financial resource strain: Not on file  . Food insecurity:    Worry: Not on file    Inability: Not on file  . Transportation needs:    Medical: Not on file    Non-medical: Not on file   Tobacco Use  . Smoking status: Former Smoker    Packs/day: 0.25    Years: 17.00    Pack years: 4.25    Types: Cigarettes    Last attempt to quit: 12/2016    Years since quitting: 1.2  . Smokeless tobacco: Never Used  Substance and Sexual Activity  . Alcohol use: Yes    Alcohol/week: 2.4 - 4.2 oz    Types: 4 - 7 Glasses of wine per week  . Drug use: No  . Sexual activity: Yes    Partners: Male    Birth control/protection: Injection    Comment: Depo Provera   Lifestyle  . Physical activity:    Days per week: Not on file    Minutes per session: Not on file  . Stress: Not on file  Relationships  . Social connections:    Talks on phone: Not on file    Gets together: Not on file    Attends religious service: Not on file    Active member of club or organization: Not on file    Attends meetings of clubs or organizations: Not on file    Relationship status: Not on file  . Intimate partner violence:    Fear of current or ex partner: Not on file    Emotionally abused: Not on file    Physically abused: Not on file    Forced sexual activity: Not on file  Other Topics Concern  . Not on file  Social History Narrative  . Not on file    Review of Systems  Constitutional: Negative.   HENT: Negative.   Eyes: Negative.   Respiratory: Negative.   Cardiovascular: Negative.   Gastrointestinal:       Rectal bleeding after BM   Genitourinary: Negative.   Musculoskeletal: Negative.   Skin: Negative.   Neurological: Negative.   Endo/Heme/Allergies: Negative.   Psychiatric/Behavioral: Negative.     PHYSICAL EXAMINATION:    BP 122/80 (BP Location: Right Arm, Patient Position: Sitting, Cuff Size: Normal)   Pulse 92   Resp 14   Wt 170 lb (77.1 kg)   LMP  (LMP Unknown)   BMI 34.34 kg/m     General appearance: alert, cooperative and appears stated age Abdomen: soft, non-tender; non distended, no masses,  no organomegaly. Incisions well healed  Pelvic: External genitalia:  no  lesions  Urethra:  normal appearing urethra with no masses, tenderness or lesions              Bartholins and Skenes: normal                 Vagina: erythematous appearing vagina with an increase in thick, clumpy white vaginal d/c. Cuff healing well, not tender              Cervix: absent              Bimanual Exam:  Uterus:  uterus absent              Adnexa: no mass, fullness, tenderness              Anus: fissure noted at 1 o'clock, small condyloma noted  Chaperone was present for exam.  Wet prep: no clue, no trich, ++ wbc KOH: +++ yeast PH: 4.5  ASSESSMENT 4 weeks s/p TLH/BS, doing well Yeast vaginitis Anal fissure    PLAN Treat with diflucan Try metamucil Information given on yeast and fissures F/U for annual exam in 5/19 No intercourse until 12 weeks post op   An After Visit Summary was printed and given to the patient.

## 2018-04-18 ENCOUNTER — Ambulatory Visit: Payer: 59 | Admitting: Obstetrics and Gynecology

## 2018-04-18 ENCOUNTER — Encounter: Payer: Self-pay | Admitting: Obstetrics and Gynecology

## 2018-11-11 ENCOUNTER — Ambulatory Visit (HOSPITAL_COMMUNITY)
Admission: EM | Admit: 2018-11-11 | Discharge: 2018-11-11 | Disposition: A | Discharge status: Home / Self Care | Payer: BLUE CROSS/BLUE SHIELD | Attending: Family Medicine | Admitting: Family Medicine

## 2018-11-11 ENCOUNTER — Encounter (HOSPITAL_COMMUNITY): Payer: Self-pay | Admitting: Emergency Medicine

## 2018-11-11 DIAGNOSIS — G43009 Migraine without aura, not intractable, without status migrainosus: Secondary | ICD-10-CM

## 2018-11-11 MED ORDER — DEXAMETHASONE SODIUM PHOSPHATE 10 MG/ML IJ SOLN
INTRAMUSCULAR | Status: AC
  Filled 2018-11-11: qty 1

## 2018-11-11 MED ORDER — METOCLOPRAMIDE HCL 5 MG/ML IJ SOLN
5.0000 mg | Freq: Once | INTRAMUSCULAR | Status: AC
  Administered 2018-11-11: 5 mg via INTRAMUSCULAR

## 2018-11-11 MED ORDER — KETOROLAC TROMETHAMINE 60 MG/2ML IM SOLN
60.0000 mg | Freq: Once | INTRAMUSCULAR | Status: AC
  Administered 2018-11-11: 60 mg via INTRAMUSCULAR

## 2018-11-11 MED ORDER — DEXAMETHASONE SODIUM PHOSPHATE 10 MG/ML IJ SOLN
10.0000 mg | Freq: Once | INTRAMUSCULAR | Status: AC
  Administered 2018-11-11: 10 mg via INTRAMUSCULAR

## 2018-11-11 MED ORDER — NAPROXEN 500 MG PO TABS: 500.0000 mg | ORAL_TABLET | Freq: Two times a day (BID) | ORAL | 0 refills | Status: AC

## 2018-11-11 MED ORDER — METOCLOPRAMIDE HCL 5 MG/ML IJ SOLN
INTRAMUSCULAR | Status: AC
  Filled 2018-11-11: qty 2

## 2018-11-11 MED ORDER — KETOROLAC TROMETHAMINE 60 MG/2ML IM SOLN
INTRAMUSCULAR | Status: AC
  Filled 2018-11-11: qty 2

## 2018-11-11 NOTE — ED Triage Notes (Signed)
Pt sts generalized HA starting last night that is similar to migraines in past

## 2018-11-11 NOTE — ED Provider Notes (Signed)
[facility]    CSN: [id number] Arrival date & time: 11/11/18  1037     History   Chief Complaint Chief Complaint  Patient presents with  . Headache    HPI Claudia Ball is a 37 y.o. female history of hypertension, hyperlipidemia, presenting today for evaluation of headache. She has had a headache since last night. Feels like typical migraine. Has history of migranes, on amovig for prevention. Located on frontal region, worse on left side. Associated nausea, vomiting, photophobia. Occasional dizziness. Denies vision changes, acute onset, worst headache of life. Patient has tried [place]. Denies changes in vision, worst headache of life, one sided weakness, difficulty speaking. Denies fever, night sweats, and weight loss.   HPI  Past Medical History:  Diagnosis Date  . Anxiety   . Bronchitis    patient states get a "touch of bronchitis every year when the season changes"   . Ectopic pregnancy   . Endometriosis   . Former smoker    quit 12/2016  . GERD (gastroesophageal reflux disease)    occasional - diet  controlled  . Headache   . Hyperlipidemia    no med, diet controlled  . Hypertension   . Miscarriage   . Seasonal allergies   . STD (sexually transmitted disease)    HSV II   . SVD (spontaneous vaginal delivery)    x 1    Patient Active Problem List   Diagnosis Date Noted  . Status post laparoscopic hysterectomy 01/28/2018    Past Surgical History:  Procedure Laterality Date  . ABDOMINAL HYSTERECTOMY    . CYSTOSCOPY N/A 01/28/2018   Procedure: CYSTOSCOPY;  Surgeon: Salvadore Dom, MD;  Location: [facility];  Service: Gynecology;  Laterality: N/A;  . ECTOPIC PREGNANCY SURGERY  2015  . GYNECOLOGIC CRYOSURGERY    . TOTAL LAPAROSCOPIC HYSTERECTOMY WITH SALPINGECTOMY Bilateral 01/28/2018   Procedure: TOTAL LAPAROSCOPIC HYSTERECTOMY WITH SALPINGECTOMY;  Surgeon: Salvadore Dom, MD;  Location: [facility];  Service: Gynecology;  Laterality: Bilateral;      OB History    Gravida  4   Para  1   Term  1   Preterm      AB  3   Living  1     SAB  2   TAB      Ectopic  1   Multiple      Live Births  1            Home Medications    Prior to Admission medications   Medication Sig Start Date End Date Taking? Authorizing Provider  acyclovir (ZOVIRAX) 400 MG tablet 1 tab po BID Patient taking differently: Take 400 mg by mouth daily.  01/11/17   Salvadore Dom, MD  amphetamine-dextroamphetamine (ADDERALL XR) 10 MG 24 hr capsule Take 10 mg by mouth daily.    [provider]  busPIRone (BUSPAR) 5 MG tablet Take 5 mg by mouth 2 (two) times daily. 12/11/16   [provider]  docusate sodium (COLACE) 100 MG capsule Take 1 capsule (100 mg total) by mouth 2 (two) times daily. 01/29/18   Salvadore Dom, MD  hydrochlorothiazide (HYDRODIURIL) 25 MG tablet Take 25 mg by mouth daily. 06/20/17   [provider]  ibuprofen (ADVIL,MOTRIN) 800 MG tablet Take 1 tablet (800 mg total) by mouth every 8 (eight) hours as needed. 01/29/18   Salvadore Dom, MD  Multiple Vitamins-Minerals (MULTIVITAMIN WITH MINERALS) tablet Take 1 tablet by mouth daily.    [provider]  naproxen (NAPROSYN) 500 MG tablet Take 1 tablet (500 mg total) by mouth 2 (two) times daily. 11/11/18   Wieters, Hallie C, PA-C  SUMAtriptan (IMITREX) 5 MG/ACT nasal spray Place 1 spray into the nose every 2 (two) hours as needed for migraine.    [provider]  zolpidem (AMBIEN) 10 MG tablet TAKE 1 TABLET BY MOUTH NIGHTLY AT BEDTIME AS NEEDED SLEEP 12/11/16   [provider]    Family History Family History  Problem Relation Age of Onset  . Hypertension Mother   . Diabetes Mother   . Hypertension Father   . Diabetes Maternal Grandmother   . Hyperlipidemia Maternal Grandmother   . Heart failure Maternal Grandfather     Social History Social History   Tobacco Use  . Smoking status: Former Smoker     Packs/day: 0.25    Years: 17.00    Pack years: 4.25    Types: Cigarettes    Last attempt to quit: 12/2016    Years since quitting: 1.9  . Smokeless tobacco: Never Used  Substance Use Topics  . Alcohol use: Yes    Alcohol/week: 4.0 - 7.0 standard drinks    Types: 4 - 7 Glasses of wine per week  . Drug use: No     Allergies   Patient has no known allergies.   Review of Systems Review of Systems  Constitutional: Negative for fatigue and fever.  HENT: Negative for congestion, sinus pressure and sore throat.   Eyes: Positive for photophobia. Negative for pain and visual disturbance.  Respiratory: Negative for cough and shortness of breath.   Cardiovascular: Negative for chest pain.  Gastrointestinal: Positive for nausea and vomiting. Negative for abdominal pain.  Genitourinary: Negative for decreased urine volume and hematuria.  Musculoskeletal: Negative for myalgias, neck pain and neck stiffness.  Neurological: Positive for dizziness and headaches. Negative for syncope, facial asymmetry, speech difficulty, weakness, light-headedness and numbness.     Physical Exam Triage Vital Signs ED Triage Vitals [11/11/18 1139]  Enc Vitals Group     BP 125/81     Pulse Rate 87     Resp 18     Temp 98 F (36.7 C)     Temp Source Oral     SpO2 100 %     Weight      Height      Head Circumference      Peak Flow      Pain Score 10     Pain Loc      Pain Edu?      Excl. in [place]?    No data found.  Updated Vital Signs BP 125/81 (BP Location: Right Arm)   Pulse 87   Temp 98 F (36.7 C) (Oral)   Resp 18   LMP  (LMP Unknown)   SpO2 100%   Visual Acuity Right Eye Distance:   Left Eye Distance:   Bilateral Distance:    Right Eye Near:   Left Eye Near:    Bilateral Near:     Physical Exam  Constitutional: She is oriented to person, place, and time. She appears well-developed and well-nourished. No distress.  HENT:  Head: Normocephalic and atraumatic.  Bilateral ears  without tenderness to palpation of external auricle, tragus and mastoid, EAC's without erythema or swelling, TM's with good bony landmarks and cone of light. Non erythematous.  Oral mucosa pink and moist, no tonsillar enlargement or exudate. Posterior pharynx patent and nonerythematous, no uvula deviation or  swelling. Normal phonation.  Eyes: Pupils are equal, round, and reactive to light. Conjunctivae and EOM are normal.  Wearing sunglasses  Neck: Neck supple.  Cardiovascular: Normal rate and regular rhythm.  No murmur heard. Pulmonary/Chest: Effort normal and breath sounds normal. No respiratory distress.  Breathing comfortably at rest, CTABL, no wheezing, rales or other adventitious sounds auscultated   Abdominal: Soft. There is no tenderness.  Musculoskeletal: She exhibits no edema.  Neurological: She is alert and oriented to person, place, and time.  Patient A&O x3, cranial nerves II-XII grossly intact, strength at shoulders, hips and knees 5/5, equal bilaterally, patellar reflex 2+ bilaterally. Normal Finger to nose, RAM and heel to shin. Negative Romberg and Pronator Drift. Gait without abnormality.  Skin: Skin is warm and dry.  Psychiatric: She has a normal mood and affect.  Nursing note and vitals reviewed.    UC Treatments / Results  Labs (all labs ordered are listed, but only abnormal results are displayed) Labs Reviewed - No data to display  EKG None  Radiology No results found.  Procedures Procedures (including critical care time)  Medications Ordered in UC Medications  ketorolac (TORADOL) injection 60 mg (60 mg Intramuscular Given 11/11/18 1302)  metoCLOPramide (REGLAN) injection 5 mg (5 mg Intramuscular Given 11/11/18 1302)  dexamethasone (DECADRON) injection 10 mg (10 mg Intramuscular Given 11/11/18 1302)    Initial Impression / Assessment and Plan / UC Course  I have reviewed the triage vital signs and the nursing notes.  Pertinent labs & imaging results  that were available during my care of the patient were reviewed by me and considered in my medical decision making (see chart for details).     Headache x 2 days, no neuro deficit, VSS. Will treat with toradol, reglan and decadron prior to d/c. Naprosyn for further HA management. ED if HA persisting, worsening, or symptoms worsening/changing.Discussed strict return precautions. Patient verbalized understanding and is agreeable with plan.  Final Clinical Impressions(s) / UC Diagnoses   Final diagnoses:  Migraine without aura and without status migrainosus, not intractable     Discharge Instructions     We gave you a shot of Toradol, Reglan and Decadron today for your headache.  They should begin working in 30 to 40 minutes. You may continue to use Naprosyn twice daily as needed for further headache management  Please follow-up in emergency room of headache persisting, worsening, developing vision changes, weakness, difficulty speaking or headache different from normal   ED Prescriptions    Medication Sig Dispense Auth. Provider   naproxen (NAPROSYN) 500 MG tablet Take 1 tablet (500 mg total) by mouth 2 (two) times daily. 30 tablet Wieters, Brandon C, PA-C     Controlled Substance Prescriptions [place] Controlled Substance Registry consulted? Not Applicable   Janith Lima, [place] 11/12/18 2304

## 2018-11-11 NOTE — Discharge Instructions (Signed)
We gave you a shot of Toradol, Reglan and Decadron today for your headache.  They should begin working in 30 to 40 minutes. You may continue to use Naprosyn twice daily as needed for further headache management  Please follow-up in emergency room of headache persisting, worsening, developing vision changes, weakness, difficulty speaking or headache different from normal

## 2018-11-21 ENCOUNTER — Ambulatory Visit (HOSPITAL_COMMUNITY)
Admission: EM | Admit: 2018-11-21 | Discharge: 2018-11-21 | Disposition: A | Discharge status: Home / Self Care | Payer: BLUE CROSS/BLUE SHIELD

## 2018-11-21 ENCOUNTER — Other Ambulatory Visit: Payer: Self-pay

## 2018-11-21 ENCOUNTER — Encounter (HOSPITAL_COMMUNITY): Payer: Self-pay | Admitting: Family Medicine

## 2018-11-21 DIAGNOSIS — J069 Acute upper respiratory infection, unspecified: Secondary | ICD-10-CM | POA: Insufficient documentation

## 2018-11-21 MED ORDER — AZITHROMYCIN 250 MG PO TABS: 250.0000 mg | ORAL_TABLET | Freq: Every day | ORAL | 0 refills | Status: AC

## 2018-11-21 MED ORDER — HYDROCODONE-HOMATROPINE 5-1.5 MG/5ML PO SYRP: 5.0000 mL | ORAL_SOLUTION | Freq: Four times a day (QID) | ORAL | 0 refills | Status: AC | PRN

## 2018-11-21 NOTE — ED Provider Notes (Signed)
[facility]    CSN: [id number] Arrival date & time: 11/21/18  1029     History   Chief Complaint Chief Complaint  Patient presents with  . URI    HPI Claudia Ball is a 37 y.o. female.   Patient is a 37 year old female that presents with approximate 2 to 3 days of cough, congestion, body aches, low-grade fever.  Her symptoms been constant and worsening.  She has been using over-the-counter Mucinex, Delsym.  She is also been using Best boy.  None of these medications are relieving her symptoms.  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  She denies any nausea, vomiting, diarrhea.  She denies any ear pain or sore throat.  ROS per HPI      Past Medical History:  Diagnosis Date  . Anxiety   . Bronchitis    patient states get a "touch of bronchitis every year when the season changes"   . Ectopic pregnancy   . Endometriosis   . Former smoker    quit 12/2016  . GERD (gastroesophageal reflux disease)    occasional - diet  controlled  . Headache   . Hyperlipidemia    no med, diet controlled  . Hypertension   . Miscarriage   . Seasonal allergies   . STD (sexually transmitted disease)    HSV II   . SVD (spontaneous vaginal delivery)    x 1    Patient Active Problem List   Diagnosis Date Noted  . Status post laparoscopic hysterectomy 01/28/2018    Past Surgical History:  Procedure Laterality Date  . ABDOMINAL HYSTERECTOMY    . CYSTOSCOPY N/A 01/28/2018   Procedure: CYSTOSCOPY;  Surgeon: Salvadore Dom, MD;  Location: [facility];  Service: Gynecology;  Laterality: N/A;  . ECTOPIC PREGNANCY SURGERY  2015  . GYNECOLOGIC CRYOSURGERY    . TOTAL LAPAROSCOPIC HYSTERECTOMY WITH SALPINGECTOMY Bilateral 01/28/2018   Procedure: TOTAL LAPAROSCOPIC HYSTERECTOMY WITH SALPINGECTOMY;  Surgeon: Salvadore Dom, MD;  Location: [facility];  Service: Gynecology;  Laterality: Bilateral;    OB History    Gravida  4   Para  1   Term  1     Preterm      AB  3   Living  1     SAB  2   TAB      Ectopic  1   Multiple      Live Births  1            Home Medications    Prior to Admission medications   Medication Sig Start Date End Date Taking? Authorizing Provider  acyclovir (ZOVIRAX) 400 MG tablet 1 tab po BID Patient taking differently: Take 400 mg by mouth daily.  01/11/17  Yes Salvadore Dom, MD  AIMOVIG 140 MG/ML SOAJ  10/25/18  Yes [provider]  amphetamine-dextroamphetamine (ADDERALL XR) 10 MG 24 hr capsule Take 10 mg by mouth daily.   Yes [provider]  busPIRone (BUSPAR) 5 MG tablet Take 5 mg by mouth 2 (two) times daily. 12/11/16  Yes [provider]  docusate sodium (COLACE) 100 MG capsule Take 1 capsule (100 mg total) by mouth 2 (two) times daily. 01/29/18  Yes Salvadore Dom, MD  eszopiclone Johnnye Sima) 1 MG TABS tablet  10/17/18  Yes [provider]  hydrochlorothiazide (HYDRODIURIL) 25 MG tablet Take 25 mg by mouth daily. 06/20/17  Yes [provider]  Multiple Vitamins-Minerals (MULTIVITAMIN WITH MINERALS) tablet  Take 1 tablet by mouth daily.   Yes [provider]  naproxen (NAPROSYN) 500 MG tablet Take 1 tablet (500 mg total) by mouth 2 (two) times daily. 11/11/18  Yes Wieters, Hallie C, PA-C  azithromycin (ZITHROMAX) 250 MG tablet Take 1 tablet (250 mg total) by mouth daily. Take first 2 tablets together, then 1 every day until finished. 11/21/18   Aquarius Latouche, Tressia Miners A, NP  HYDROcodone-homatropine (HYCODAN) 5-1.5 MG/5ML syrup Take 5 mLs by mouth every 6 (six) hours as needed for cough. 11/21/18   Loura Halt A, NP  ibuprofen (ADVIL,MOTRIN) 800 MG tablet Take 1 tablet (800 mg total) by mouth every 8 (eight) hours as needed. 01/29/18   Salvadore Dom, MD  SUMAtriptan Dellis Filbert) 5 MG/ACT nasal spray Place 1 spray into the nose every 2 (two) hours as needed for migraine.    [provider]  zolpidem (AMBIEN) 10 MG tablet TAKE 1  TABLET BY MOUTH NIGHTLY AT BEDTIME AS NEEDED SLEEP 12/11/16   [provider]    Family History Family History  Problem Relation Age of Onset  . Hypertension Mother   . Diabetes Mother   . Hypertension Father   . Diabetes Maternal Grandmother   . Hyperlipidemia Maternal Grandmother   . Heart failure Maternal Grandfather     Social History Social History   Tobacco Use  . Smoking status: Former Smoker    Packs/day: 0.25    Years: 17.00    Pack years: 4.25    Types: Cigarettes    Last attempt to quit: 12/2016    Years since quitting: 1.9  . Smokeless tobacco: Never Used  Substance Use Topics  . Alcohol use: Yes    Alcohol/week: 4.0 - 7.0 standard drinks    Types: 4 - 7 Glasses of wine per week  . Drug use: No     Allergies   Ibuprofen and Tramadol   Review of Systems Review of Systems   Physical Exam Triage Vital Signs ED Triage Vitals [11/21/18 1059]  Enc Vitals Group     BP (!) 135/50     Pulse Rate 93     Resp      Temp 97.7 F (36.5 C)     Temp Source Oral     SpO2 100 %     Weight      Height      Head Circumference      Peak Flow      Pain Score 7     Pain Loc      Pain Edu?      Excl. in [place]?    No data found.  Updated Vital Signs BP (!) 135/50 (BP Location: Right Arm)   Pulse 93   Temp 97.7 F (36.5 C) (Oral)   LMP  (LMP Unknown)   SpO2 100%   Visual Acuity Right Eye Distance:   Left Eye Distance:   Bilateral Distance:    Right Eye Near:   Left Eye Near:    Bilateral Near:     Physical Exam Vitals signs and nursing note reviewed.  Constitutional:      Appearance: Normal appearance.  HENT:     Head: Normocephalic and atraumatic.     Right Ear: Tympanic membrane, ear canal and external ear normal.     Left Ear: Tympanic membrane, ear canal and external ear normal.     Nose: Congestion and rhinorrhea present.     Mouth/Throat:     Mouth: Mucous membranes are moist.  Pharynx: Oropharynx is clear. Posterior  oropharyngeal erythema present.  Eyes:     Conjunctiva/sclera: Conjunctivae normal.  Neck:     Musculoskeletal: Normal range of motion.  Cardiovascular:     Rate and Rhythm: Normal rate and regular rhythm.     Heart sounds: Normal heart sounds.  Pulmonary:     Effort: Pulmonary effort is normal.     Breath sounds: Normal breath sounds.  Musculoskeletal: Normal range of motion.  Lymphadenopathy:     Cervical: No cervical adenopathy.  Skin:    General: Skin is warm and dry.  Neurological:     Mental Status: She is alert.  Psychiatric:        Mood and Affect: Mood normal.      UC Treatments / Results  Labs (all labs ordered are listed, but only abnormal results are displayed) Labs Reviewed - No data to display  EKG None  Radiology No results found.  Procedures Procedures (including critical care time)  Medications Ordered in UC Medications - No data to display  Initial Impression / Assessment and Plan / UC Course  I have reviewed the triage vital signs and the nursing notes.  Pertinent labs & imaging results that were available during my care of the patient were reviewed by me and considered in my medical decision making (see chart for details).     Exam mostly normal. Patient symptoms consistent with a viral upper respiratory infection We will send something stronger in for cough since then the medication she is taking is working. Printed out prescription of Z-Pak given to take if worsening symptoms over the next 4 to 5 days. Note for work given Follow up as needed for continued or worsening symptoms   Final Clinical Impressions(s) / UC Diagnoses   Final diagnoses:  Viral upper respiratory tract infection     Discharge Instructions     This is a viral upper respiratory infection We will go ahead and give you some stronger for the cough Try adding some Zyrtec to your regimen Continue the other medications you have been taking, specifically the  Mucinex I am giving a printed prescription for a Z-Pak  If symptoms continue or worsen over the next couple days go ahead and start that Follow up as needed for continued or worsening symptoms     ED Prescriptions    Medication Sig Dispense Auth. Provider   HYDROcodone-homatropine (HYCODAN) 5-1.5 MG/5ML syrup Take 5 mLs by mouth every 6 (six) hours as needed for cough. 120 mL Rooney Swails A, NP   azithromycin (ZITHROMAX) 250 MG tablet Take 1 tablet (250 mg total) by mouth daily. Take first 2 tablets together, then 1 every day until finished. 6 tablet Loura Halt A, NP     Controlled Substance Prescriptions [place] Controlled Substance Registry consulted? Not Applicable   Orvan July, NP 11/21/18 1303

## 2018-11-21 NOTE — ED Triage Notes (Signed)
Pt reports nasal congestion and cough that started two days ago.  She reports "pins and needles" throughout her body when she coughs.  She has been taking Mucinex, Delsyum, and Tessalon with no relief.

## 2018-11-21 NOTE — Discharge Instructions (Signed)
This is a viral upper respiratory infection We will go ahead and give you some stronger for the cough Try adding some Zyrtec to your regimen Continue the other medications you have been taking, specifically the Mucinex I am giving a printed prescription for a Z-Pak  If symptoms continue or worsen over the next couple days go ahead and start that Follow up as needed for continued or worsening symptoms

## 2018-12-19 IMAGING — US US PELVIS COMPLETE
1 series · 14 of 25 positions shown · non-contrast
Comparison: None

CLINICAL DATA: Prominence and cervix.  History of endometriosis

EXAM:
TRANSABDOMINAL AND TRANSVAGINAL ULTRASOUND OF PELVIS
TECHNIQUE: Study was performed transabdominally to optimize pelvic field of
view evaluation and transvaginally to optimize internal visceral
architecture evaluation.

[Series 1: us pelvis complete · 0.23mm/px · 14 of 90 slices shown]
[im 1/90]
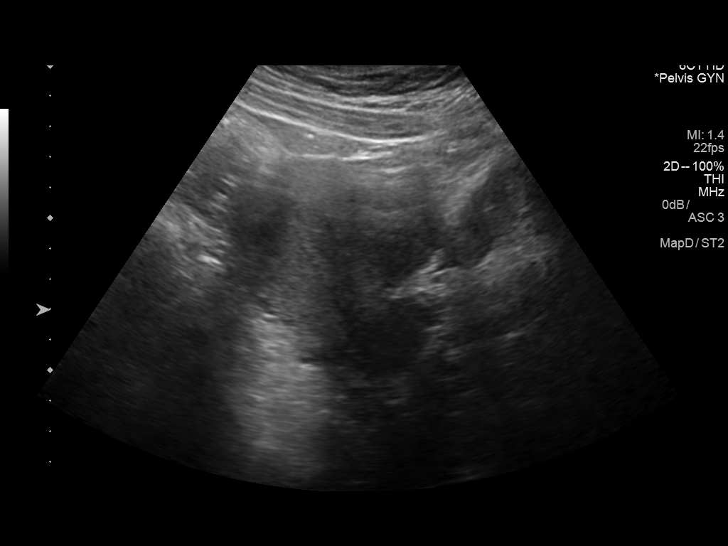
[im 8/90]
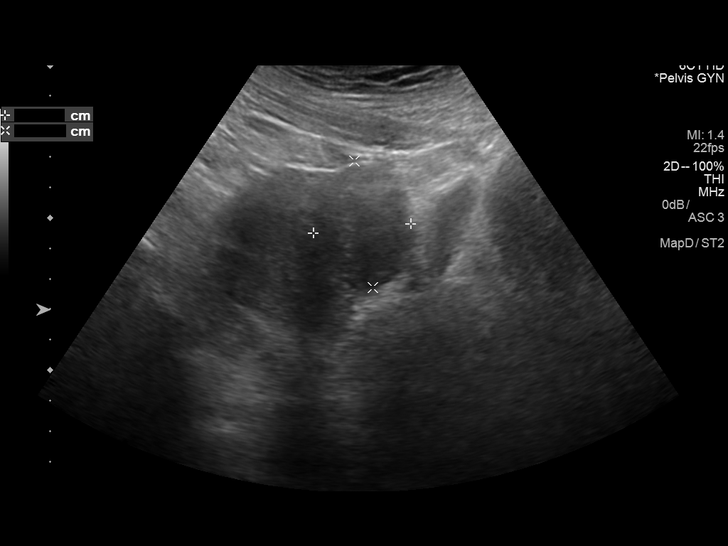
[im 15/90]
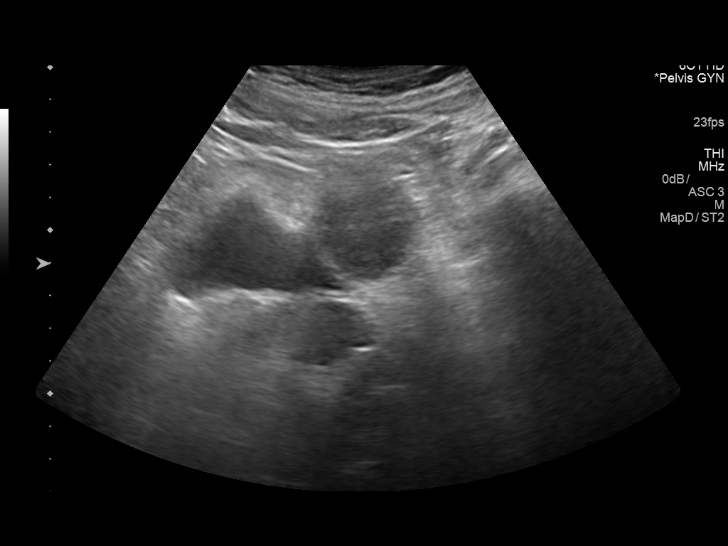
[im 23/90]
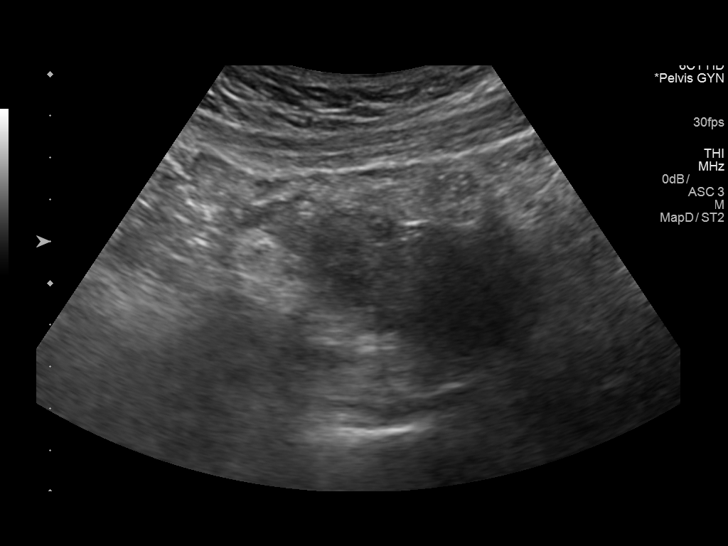
[im 30/90]
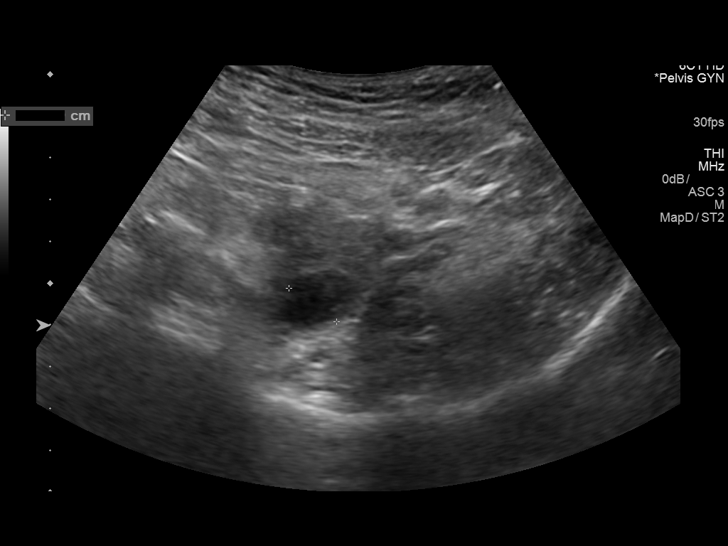
[im 34/90]
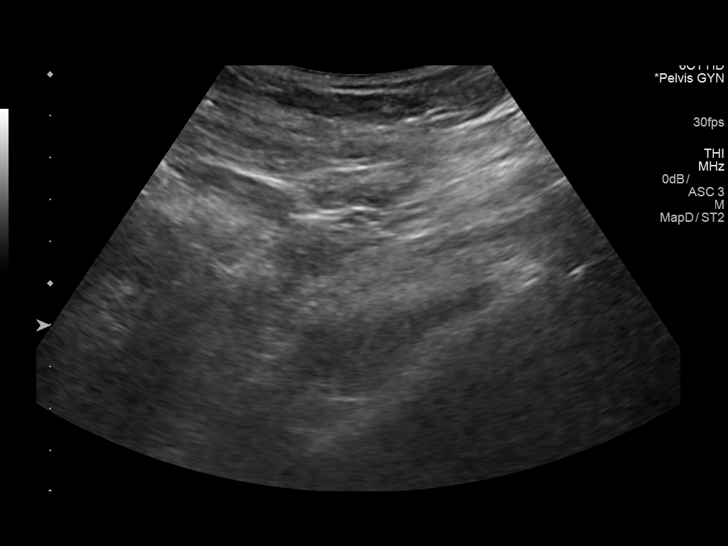
[im 41/90]
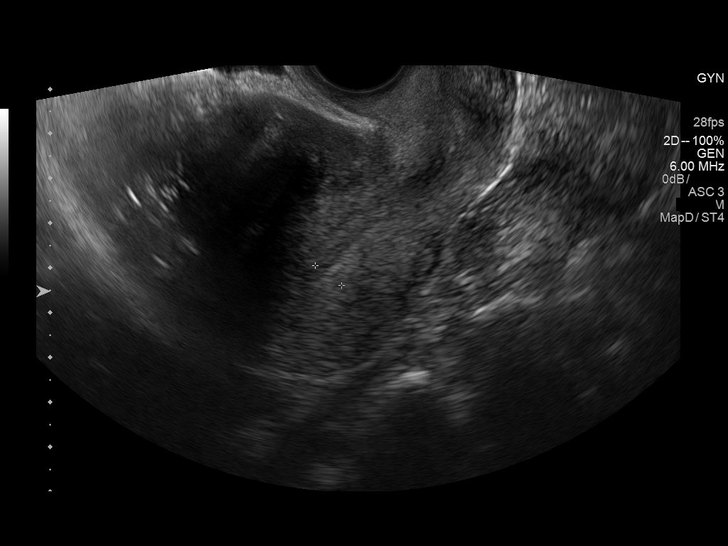
[im 49/90]
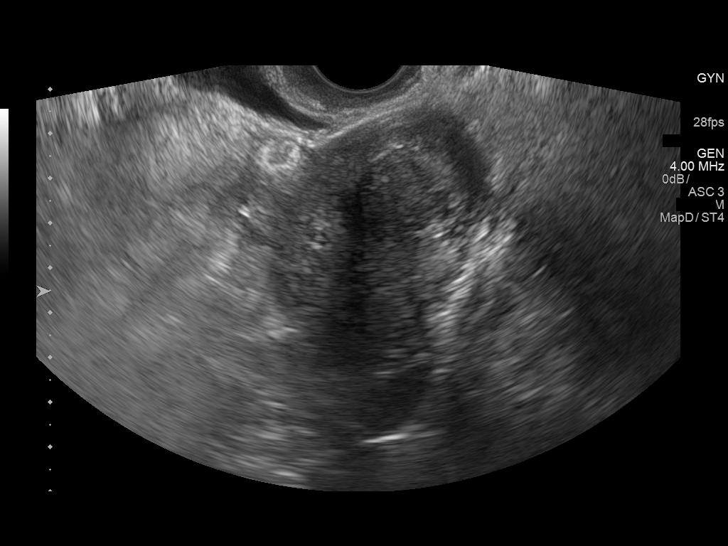
[im 56/90]
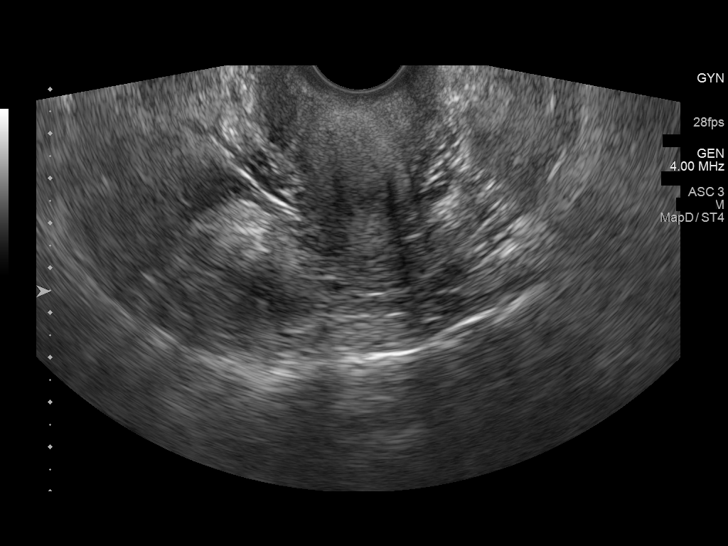
[im 60/90]
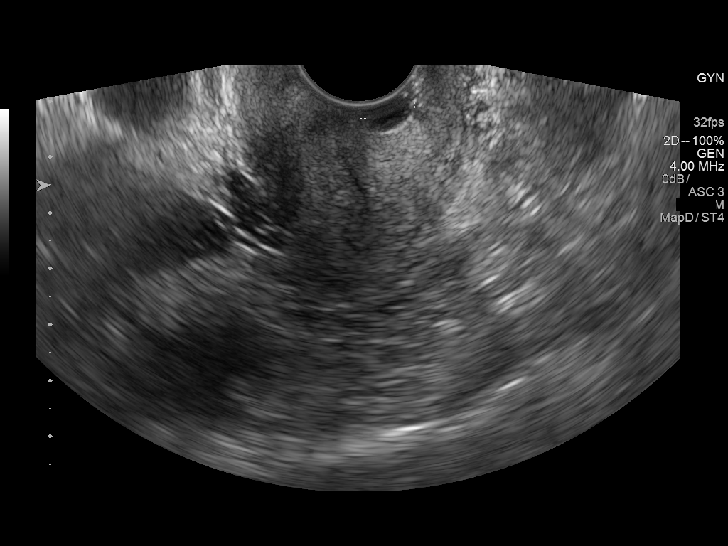
[im 67/90]
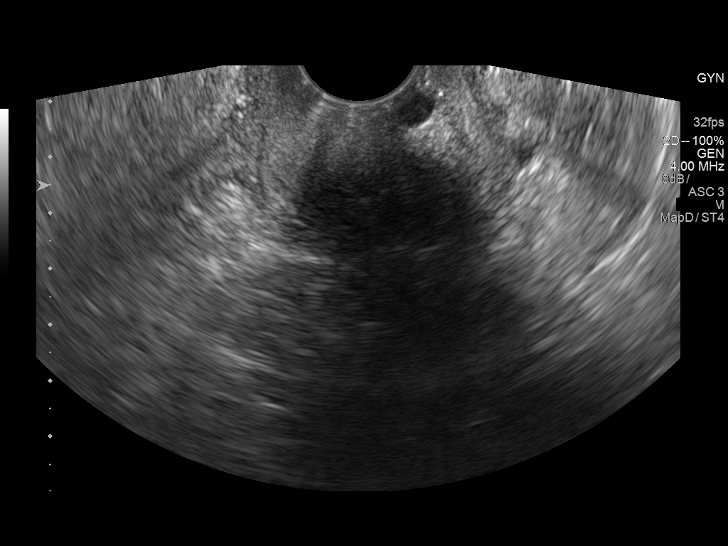
[im 75/90]
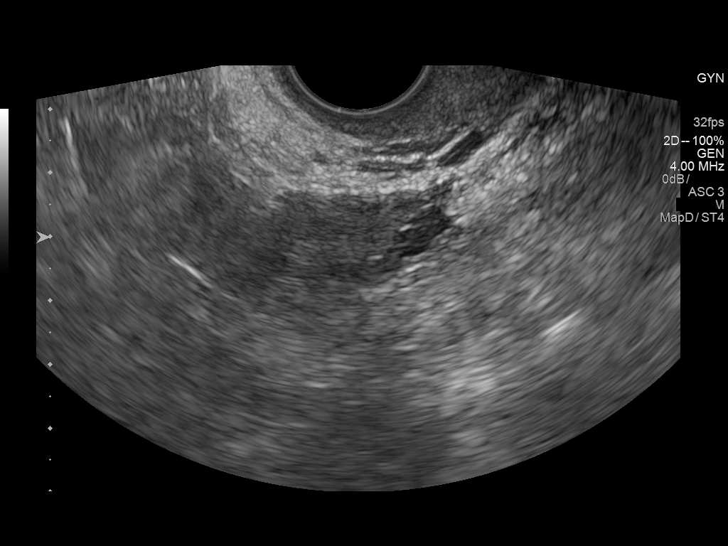
[im 82/90]
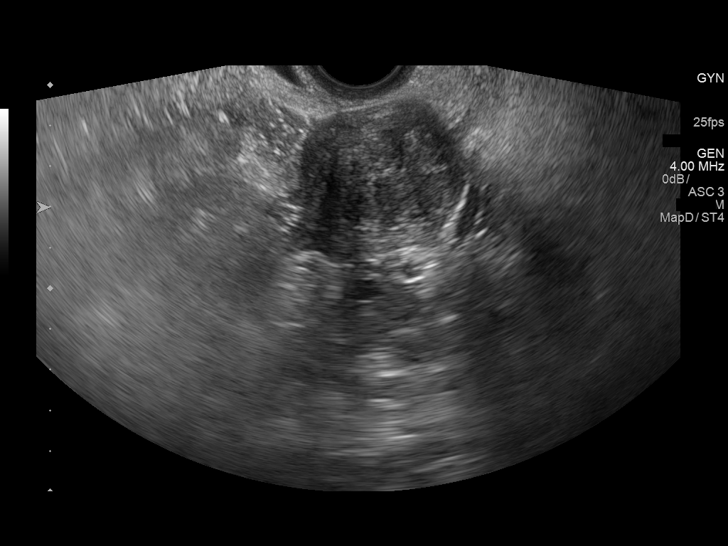
[im 90/90]
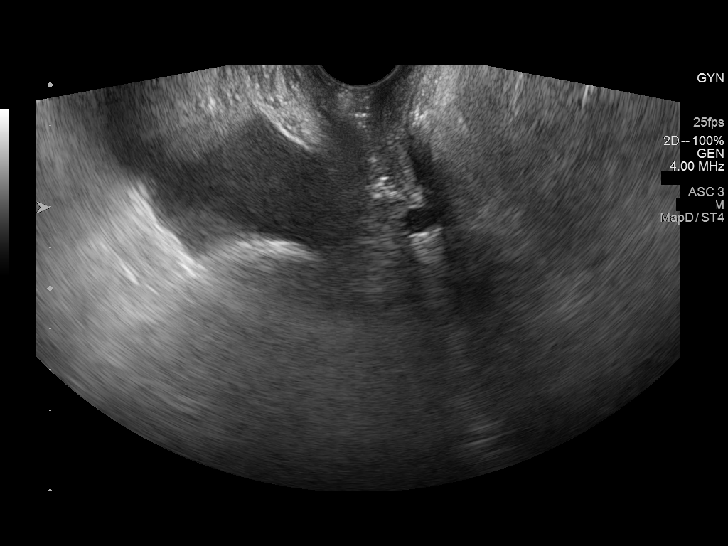

[14 of 25 positions shown; findings below may reference images not displayed]

FINDINGS: Uterus

Measurements: 8.1 x 6.8 x 4.8 cm. There is a hypoechoic mass arising
from the anterior uterine fundus measuring 3.2 x 3.4 x 4.0 cm
consistent with a leiomyoma. No other intrauterine mass is evident.
There is a nabothian cyst arising from the cervix measuring 0.8 x
0.5 x 0.5 cm..

Endometrium

Thickness: 7 mm.  No focal abnormality visualized.

Right ovary

Measurements: 3.1 x 1.6 x 3.3 cm. Normal appearance/no adnexal mass.

Left ovary

Measurements: 2.7 x 2.2 x 2.2 cm.. Normal appearance/no adnexal
mass.

Other findings

No abnormal free fluid.
IMPRESSION: Leiomyoma measuring 3.2 x 3.4 x 4.0 cm arising from the anterior
leftward aspect of the uterine fundus. Subcentimeter nabothian cyst
arising from the cervix. Study otherwise unremarkable.

## 2020-03-17 ENCOUNTER — Ambulatory Visit: Payer: Self-pay | Attending: Internal Medicine

## 2020-03-17 DIAGNOSIS — Z23 Encounter for immunization: Secondary | ICD-10-CM

## 2020-03-17 NOTE — Progress Notes (Signed)
   Covid-19 Vaccination Clinic  Name:  Dondi Clarida    MRN: [id number] DOB: Jan 06, 1981  03/17/2020  Ms. Brown-Long was observed post Covid-19 immunization for 15 minutes without incident. She was provided with Vaccine Information Sheet and instruction to access the V-Safe system.   Ms. Montanari was instructed to call 911 with any severe reactions post vaccine: Marland Kitchen Difficulty breathing  . Swelling of face and throat  . A fast heartbeat  . A bad rash all over body  . Dizziness and weakness   Immunizations Administered    Name Date Dose VIS Date Route   Moderna COVID-19 Vaccine 03/17/2020  8:14 AM 0.5 mL 11/04/2019 Intramuscular   Manufacturer: Moderna   LotMV:[id number]   [id number]

## 2021-01-03 ENCOUNTER — Other Ambulatory Visit: Payer: Self-pay | Admitting: Family Medicine

## 2021-01-03 DIAGNOSIS — M5126 Other intervertebral disc displacement, lumbar region: Secondary | ICD-10-CM

## 2021-01-03 DIAGNOSIS — M5136 Other intervertebral disc degeneration, lumbar region: Secondary | ICD-10-CM

## 2021-01-16 ENCOUNTER — Other Ambulatory Visit: Payer: Self-pay

## 2021-04-23 ENCOUNTER — Other Ambulatory Visit: Payer: Medicaid Other

## 2022-01-06 DIAGNOSIS — I10 Essential (primary) hypertension: Secondary | ICD-10-CM | POA: Diagnosis not present

## 2022-01-06 DIAGNOSIS — Z0000 Encounter for general adult medical examination without abnormal findings: Secondary | ICD-10-CM | POA: Diagnosis not present

## 2022-01-06 DIAGNOSIS — F988 Other specified behavioral and emotional disorders with onset usually occurring in childhood and adolescence: Secondary | ICD-10-CM | POA: Diagnosis not present

## 2022-01-06 DIAGNOSIS — M5126 Other intervertebral disc displacement, lumbar region: Secondary | ICD-10-CM | POA: Diagnosis not present

## 2022-04-27 ENCOUNTER — Ambulatory Visit: Payer: Self-pay | Admitting: Allergy

## 2022-11-15 DIAGNOSIS — Z124 Encounter for screening for malignant neoplasm of cervix: Secondary | ICD-10-CM | POA: Diagnosis not present

## 2024-03-21 ENCOUNTER — Emergency Department (HOSPITAL_COMMUNITY)

## 2024-03-21 ENCOUNTER — Emergency Department (HOSPITAL_COMMUNITY)
Admission: EM | Admit: 2024-03-21 | Discharge: 2024-03-22 | Disposition: A | Discharge status: Home / Self Care | Attending: Emergency Medicine | Admitting: Emergency Medicine

## 2024-03-21 ENCOUNTER — Encounter (HOSPITAL_COMMUNITY): Payer: Self-pay | Admitting: *Deleted

## 2024-03-21 ENCOUNTER — Other Ambulatory Visit: Payer: Self-pay

## 2024-03-21 DIAGNOSIS — R079 Chest pain, unspecified: Secondary | ICD-10-CM

## 2024-03-21 DIAGNOSIS — R0789 Other chest pain: Secondary | ICD-10-CM | POA: Insufficient documentation

## 2024-03-21 DIAGNOSIS — R0602 Shortness of breath: Secondary | ICD-10-CM | POA: Diagnosis not present

## 2024-03-21 DIAGNOSIS — R000 Tachycardia, unspecified: Secondary | ICD-10-CM | POA: Diagnosis not present

## 2024-03-21 DIAGNOSIS — Z87891 Personal history of nicotine dependence: Secondary | ICD-10-CM | POA: Diagnosis not present

## 2024-03-21 DIAGNOSIS — I10 Essential (primary) hypertension: Secondary | ICD-10-CM | POA: Insufficient documentation

## 2024-03-21 DIAGNOSIS — Z79899 Other long term (current) drug therapy: Secondary | ICD-10-CM | POA: Insufficient documentation

## 2024-03-21 LAB — BASIC METABOLIC PANEL WITH GFR
Anion gap: 12 (ref 5–15)
BUN: 14 mg/dL (ref 6–20)
CO2: 19 mmol/L — ABNORMAL LOW (ref 22–32)
Calcium: 9.6 mg/dL (ref 8.9–10.3)
Chloride: 106 mmol/L (ref 98–111)
Creatinine, Ser: 1.56 mg/dL — ABNORMAL HIGH (ref 0.44–1.00)
GFR, Estimated: 43 mL/min — ABNORMAL LOW (ref >60)
Glucose, Bld: 85 mg/dL (ref 70–99)
Potassium: 3.5 mmol/L (ref 3.5–5.1)
Sodium: 137 mmol/L (ref 135–145)

## 2024-03-21 LAB — CBC
HCT: 44.6 % (ref 36.0–46.0)
Hemoglobin: 14.8 g/dL (ref 12.0–15.0)
MCH: 29.5 pg (ref 26.0–34.0)
MCHC: 33.2 g/dL (ref 30.0–36.0)
MCV: 88.8 fL (ref 80.0–100.0)
Platelets: 293 10*3/uL (ref 150–400)
RBC: 5.02 MIL/uL (ref 3.87–5.11)
RDW: 12.4 % (ref 11.5–15.5)
WBC: 8.8 10*3/uL (ref 4.0–10.5)
nRBC: 0 % (ref 0.0–0.2)

## 2024-03-21 LAB — TROPONIN I (HIGH SENSITIVITY)
Troponin I (High Sensitivity): 3 ng/L (ref <18)
Troponin I (High Sensitivity): 3 ng/L (ref <18)

## 2024-03-21 LAB — BRAIN NATRIURETIC PEPTIDE: B Natriuretic Peptide: 3.4 pg/mL (ref 0.0–100.0)

## 2024-03-21 MED ORDER — IOHEXOL 350 MG/ML SOLN
75.0000 mL | Freq: Once | INTRAVENOUS | Status: AC | PRN
  Administered 2024-03-21: 75 mL via INTRAVENOUS

## 2024-03-21 MED ORDER — SODIUM CHLORIDE 0.9 % IV BOLUS
500.0000 mL | Freq: Once | INTRAVENOUS | Status: AC
  Administered 2024-03-21: 500 mL via INTRAVENOUS

## 2024-03-21 NOTE — ED Notes (Signed)
 Patient transported to CT

## 2024-03-21 NOTE — Discharge Instructions (Addendum)
 Home to rest. Follow up with PCP, return to the ER for worsening or concerning symptoms.

## 2024-03-21 NOTE — ED Triage Notes (Signed)
 Pt is here for pressure and discomfort in her left chest with radiation to both shoulders.  Pt has had sob with this. Pt also reports feeling "hot"

## 2024-03-21 NOTE — ED Provider Notes (Signed)
 [facility] Provider Note   CSN: [id number] Arrival date & time: 03/21/24  1802     History  Chief Complaint  Patient presents with   Chest Pain    Claudia Ball is a 43 y.o. female.  43 year old female with complaint of left side chest pressure and SHOB. Onset this morning upon waking, radiates across chest to both shoulders. SHOB worse with exertion.  Quit smoking 2 weeks ago. No OCPs, no recent travel, no hx of cancer, no personal or family history of PE/DVT. Hx of HTN.        Home Medications Prior to Admission medications   Medication Sig Start Date End Date Taking? Authorizing Provider  acyclovir  (ZOVIRAX ) 400 MG tablet 1 tab po BID Patient taking differently: Take 400 mg by mouth daily.  01/11/17   Jertson, Jill Evelyn, MD  AIMOVIG 140 MG/ML SOAJ  10/25/18   [provider]  amphetamine-dextroamphetamine (ADDERALL XR) 10 MG 24 hr capsule Take 10 mg by mouth daily.    [provider]  azithromycin  (ZITHROMAX ) 250 MG tablet Take 1 tablet (250 mg total) by mouth daily. Take first 2 tablets together, then 1 every day until finished. 11/21/18   Adah Corning A, FNP  busPIRone  (BUSPAR ) 5 MG tablet Take 5 mg by mouth 2 (two) times daily. 12/11/16   [provider]  docusate sodium  (COLACE) 100 MG capsule Take 1 capsule (100 mg total) by mouth 2 (two) times daily. 01/29/18   Jertson, Jill Evelyn, MD  eszopiclone (LUNESTA) 1 MG TABS tablet  10/17/18   [provider]  hydrochlorothiazide  (HYDRODIURIL ) 25 MG tablet Take 25 mg by mouth daily. 06/20/17   [provider]  HYDROcodone -homatropine (HYCODAN) 5-1.5 MG/5ML syrup Take 5 mLs by mouth every 6 (six) hours as needed for cough. 11/21/18   Adah Corning A, FNP  ibuprofen  (ADVIL ,MOTRIN ) 800 MG tablet Take 1 tablet (800 mg total) by mouth every 8 (eight) hours as needed. 01/29/18   Jertson, Jill Evelyn, MD  Multiple Vitamins-Minerals (MULTIVITAMIN  WITH MINERALS) tablet Take 1 tablet by mouth daily.    [provider]  naproxen  (NAPROSYN ) 500 MG tablet Take 1 tablet (500 mg total) by mouth 2 (two) times daily. 11/11/18   Wieters, Hallie C, PA-C  SUMAtriptan  (IMITREX ) 5 MG/ACT nasal spray Place 1 spray into the nose every 2 (two) hours as needed for migraine.    [provider]  zolpidem  (AMBIEN ) 10 MG tablet TAKE 1 TABLET BY MOUTH NIGHTLY AT BEDTIME AS NEEDED SLEEP 12/11/16   [provider]      Allergies    Ibuprofen  and Tramadol     Review of Systems   Review of Systems Negative except as per HPI Physical Exam Updated Vital Signs BP 122/76   Pulse 94   Temp 98.5 F (36.9 C)   Resp 16   LMP  (LMP Unknown)   SpO2 100%  Physical Exam Vitals and nursing note reviewed.  Constitutional:      General: She is not in acute distress.    Appearance: She is well-developed. She is not diaphoretic.  HENT:     Head: Normocephalic and atraumatic.  Cardiovascular:     Rate and Rhythm: Regular rhythm. Tachycardia present.     Heart sounds: Normal heart sounds.  Pulmonary:     Effort: Pulmonary effort is normal.     Breath sounds: Normal breath sounds.  Chest:     Chest wall: No tenderness.  Abdominal:     Palpations: Abdomen is soft.     Tenderness: There is no abdominal tenderness.  Musculoskeletal:     Right lower leg: No tenderness. No edema.     Left lower leg: No tenderness. No edema.  Skin:    General: Skin is warm and dry.     Findings: No erythema or rash.  Neurological:     Mental Status: She is alert and oriented to person, place, and time.  Psychiatric:        Behavior: Behavior normal.     ED Results / Procedures / Treatments   Labs (all labs ordered are listed, but only abnormal results are displayed) Labs Reviewed  BASIC METABOLIC PANEL WITH GFR - Abnormal; Notable for the following components:      Result Value   CO2 19 (*)    Creatinine, Ser 1.56 (*)    GFR, Estimated 43 (*)     All other components within normal limits  CBC  BRAIN NATRIURETIC PEPTIDE  TROPONIN I (HIGH SENSITIVITY)  TROPONIN I (HIGH SENSITIVITY)    EKG None  Radiology CT Angio Chest PE W/Cm &/Or Wo Cm Result Date: 03/22/2024 CLINICAL DATA:  Chest pain EXAM: CT ANGIOGRAPHY CHEST WITH CONTRAST TECHNIQUE: Multidetector CT imaging of the chest was performed using the standard protocol during bolus administration of intravenous contrast. Multiplanar CT image reconstructions and MIPs were obtained to evaluate the vascular anatomy. RADIATION DOSE REDUCTION: This exam was performed according to the departmental dose-optimization program which includes automated exposure control, adjustment of the mA and/or kV according to patient size and/or use of iterative reconstruction technique. CONTRAST:  75mL OMNIPAQUE  IOHEXOL  350 MG/ML SOLN COMPARISON:  Chest x-ray 03/21/2024 FINDINGS: Cardiovascular: Satisfactory opacification of the pulmonary arteries to the segmental level. No evidence of pulmonary embolism. Normal heart size. No pericardial effusion. Nonaneurysmal aorta. No dissection Mediastinum/Nodes: No enlarged mediastinal, hilar, or axillary lymph nodes. Thyroid gland, trachea, and esophagus demonstrate no significant findings. Lungs/Pleura: Lungs are clear. No pleural effusion or pneumothorax. Upper Abdomen: No acute abnormality. Musculoskeletal: No chest wall abnormality. No acute or significant osseous findings. Review of the MIP images confirms the above findings. IMPRESSION: Negative. No CT evidence for acute pulmonary embolus. Clear lung fields. Electronically Signed   By: Luke Bun M.D.   On: 03/22/2024 00:05   DG Chest 2 View Result Date: 03/21/2024 CLINICAL DATA:  Chest pain. EXAM: CHEST - 2 VIEW COMPARISON:  September 21, 2015 FINDINGS: The heart size and mediastinal contours are within normal limits. Both lungs are clear. The visualized skeletal structures are unremarkable. IMPRESSION: No active  cardiopulmonary disease. Electronically Signed   By: Suzen Dials M.D.   On: 03/21/2024 21:35    Procedures Procedures    Medications Ordered in ED Medications  sodium chloride  0.9 % bolus 500 mL (0 mLs Intravenous Stopped 03/22/24 0117)  iohexol  (OMNIPAQUE ) 350 MG/ML injection 75 mL (75 mLs Intravenous Contrast Given 03/21/24 2341)  sodium chloride  0.9 % bolus 500 mL (0 mLs Intravenous Stopped 03/22/24 0127)    ED Course/ Medical Decision Making/ A&P                                 Medical Decision Making Amount and/or Complexity of Data Reviewed Labs: ordered. Radiology: ordered.  Risk Prescription drug management.   This patient presents to the ED for concern of chest pressure and SHOB, this involves an extensive number of treatment options,  and is a complaint that carries with it a high risk of complications and morbidity.  The differential diagnosis includes but not limited to ACS, PE, arrhythmia, CHF   Co morbidities that complicate the patient evaluation  HTN, anxiety, HLD, GERD, recently quit smoking   Additional history obtained:  External records from outside source obtained and reviewed including last visit to PCP on file 07/19/24 for acute sinusitis treated with z-pack and steroids    Lab Tests:  I Ordered, and personally interpreted labs.  The pertinent results include:  troponin 3/3, CBC WNL. BMP Cr 1.56 (elevated but most recent on file is from 6 years ago). BNP WNL   Imaging Studies ordered:  I ordered imaging studies including CXR, CT PE study  I independently visualized and interpreted imaging which showed CXR WNL, CT PE negative for PE/mass or other acute findings  I agree with the radiologist interpretation   Cardiac Monitoring: / EKG:  The patient was maintained on a cardiac monitor.  I personally viewed and interpreted the cardiac monitored which showed an underlying rhythm of: sinus tach, rate 126   Problem List / ED Course / Critical  interventions / Medication management  43 year old female with complaint of chest pressure and SHOB as above. Sinus tach on EKG, provided with IVF and CT to evaluate for PT. CT negative, work up with elevated Cr, otherwise unremarkable. Provided with IVF for elevated Cr. Recommend close follow up with PCP with return to ER precautions.  I ordered medication including IVF  for elevated Cr, tachycardia  Reevaluation of the patient after these medicines showed that the patient improved I have reviewed the patients home medicines and have made adjustments as needed   Social Determinants of Health:  Has PCP   Test / Admission - Considered:  Stable for DC, HR improved, follow up PCP, return to ER as needed.         Final Clinical Impression(s) / ED Diagnoses Final diagnoses:  Chest pain, unspecified type  Tachycardia    Rx / DC Orders ED Discharge Orders     None         Beverley Leita LABOR, PA-C 03/22/24 0216    Jerral Meth, MD 03/27/24 1504

## 2024-03-22 MED ORDER — SODIUM CHLORIDE 0.9 % IV BOLUS
500.0000 mL | Freq: Once | INTRAVENOUS | Status: AC
  Administered 2024-03-22: 500 mL via INTRAVENOUS

## 2024-03-22 MED ORDER — SODIUM CHLORIDE 0.9 % IV BOLUS: 1000.0000 mL | Freq: Once | INTRAVENOUS | Status: DC

## 2024-03-22 NOTE — ED Notes (Signed)
 Pt ambulated around nurse's station per EDP request.  Pt states feels fine, ready to go, ambulated with steady gait, denies SOB.  EDP notified.
